# Patient Record
Sex: Female | Born: 1935 | Race: White | Hispanic: No | Marital: Married | Smoking: Former smoker
Health system: Southern US, Community
[De-identification: ages and names within clinical notes are randomized; demographics above are authoritative.]

## PROBLEM LIST (undated history)

## (undated) DIAGNOSIS — I82409 Acute embolism and thrombosis of unspecified deep veins of unspecified lower extremity: Secondary | ICD-10-CM

## (undated) DIAGNOSIS — I495 Sick sinus syndrome: Secondary | ICD-10-CM

## (undated) DIAGNOSIS — I7100 Dissection of unspecified site of aorta: Secondary | ICD-10-CM

## (undated) DIAGNOSIS — Z9010 Acquired absence of unspecified breast and nipple: Secondary | ICD-10-CM

## (undated) DIAGNOSIS — I4891 Unspecified atrial fibrillation: Secondary | ICD-10-CM

## (undated) DIAGNOSIS — I10 Essential (primary) hypertension: Secondary | ICD-10-CM

## (undated) DIAGNOSIS — I493 Ventricular premature depolarization: Secondary | ICD-10-CM

## (undated) DIAGNOSIS — I491 Atrial premature depolarization: Secondary | ICD-10-CM

## (undated) DIAGNOSIS — Z952 Presence of prosthetic heart valve: Secondary | ICD-10-CM

## (undated) DIAGNOSIS — C50919 Malignant neoplasm of unspecified site of unspecified female breast: Secondary | ICD-10-CM

## (undated) HISTORY — DX: Acquired absence of unspecified breast and nipple: Z90.10

## (undated) HISTORY — PX: APPENDECTOMY: SHX54

## (undated) HISTORY — DX: Essential (primary) hypertension: I10

## (undated) HISTORY — DX: Atrial premature depolarization: I49.1

## (undated) HISTORY — DX: Dissection of unspecified site of aorta: I71.00

## (undated) HISTORY — DX: Unspecified atrial fibrillation: I48.91

## (undated) HISTORY — PX: MASTECTOMY: SHX3

## (undated) HISTORY — DX: Sick sinus syndrome: I49.5

## (undated) HISTORY — DX: Ventricular premature depolarization: I49.3

## (undated) HISTORY — DX: Presence of prosthetic heart valve: Z95.2

## (undated) HISTORY — DX: Acute embolism and thrombosis of unspecified deep veins of unspecified lower extremity: I82.409

## (undated) HISTORY — DX: Malignant neoplasm of unspecified site of unspecified female breast: C50.919

---

## 2004-08-22 ENCOUNTER — Ambulatory Visit: Payer: Self-pay | Admitting: Oncology

## 2004-11-14 ENCOUNTER — Ambulatory Visit: Payer: Self-pay | Admitting: Hematology

## 2004-12-05 ENCOUNTER — Ambulatory Visit: Payer: Self-pay | Admitting: Oncology

## 2005-02-13 ENCOUNTER — Ambulatory Visit: Payer: Self-pay | Admitting: Oncology

## 2005-05-08 ENCOUNTER — Ambulatory Visit: Payer: Self-pay | Admitting: Oncology

## 2005-08-14 ENCOUNTER — Ambulatory Visit: Payer: Self-pay | Admitting: Oncology

## 2005-11-22 ENCOUNTER — Ambulatory Visit: Payer: Self-pay | Admitting: Oncology

## 2006-04-04 ENCOUNTER — Ambulatory Visit: Payer: Self-pay | Admitting: Oncology

## 2006-04-13 ENCOUNTER — Encounter: Admission: RE | Admit: 2006-04-13 | Discharge: 2006-04-13 | Payer: Self-pay | Admitting: Oncology

## 2006-10-04 ENCOUNTER — Ambulatory Visit: Payer: Self-pay | Admitting: Oncology

## 2007-04-18 ENCOUNTER — Ambulatory Visit: Payer: Self-pay | Admitting: Oncology

## 2008-12-22 ENCOUNTER — Encounter: Payer: Self-pay | Admitting: Internal Medicine

## 2009-01-01 ENCOUNTER — Encounter: Payer: Self-pay | Admitting: Internal Medicine

## 2009-01-04 DIAGNOSIS — K219 Gastro-esophageal reflux disease without esophagitis: Secondary | ICD-10-CM

## 2009-01-04 DIAGNOSIS — R42 Dizziness and giddiness: Secondary | ICD-10-CM | POA: Insufficient documentation

## 2009-01-04 DIAGNOSIS — E781 Pure hyperglyceridemia: Secondary | ICD-10-CM

## 2009-01-04 DIAGNOSIS — I10 Essential (primary) hypertension: Secondary | ICD-10-CM

## 2009-01-06 ENCOUNTER — Ambulatory Visit: Payer: Self-pay | Admitting: Internal Medicine

## 2009-01-19 ENCOUNTER — Telehealth (INDEPENDENT_AMBULATORY_CARE_PROVIDER_SITE_OTHER): Payer: Self-pay | Admitting: Radiology

## 2009-01-20 ENCOUNTER — Encounter: Payer: Self-pay | Admitting: Internal Medicine

## 2009-01-20 ENCOUNTER — Ambulatory Visit: Payer: Self-pay

## 2009-02-11 ENCOUNTER — Ambulatory Visit: Payer: Self-pay | Admitting: Internal Medicine

## 2009-02-11 DIAGNOSIS — I062 Rheumatic aortic stenosis with insufficiency: Secondary | ICD-10-CM | POA: Insufficient documentation

## 2009-02-11 DIAGNOSIS — I4891 Unspecified atrial fibrillation: Secondary | ICD-10-CM | POA: Insufficient documentation

## 2009-06-22 ENCOUNTER — Encounter (INDEPENDENT_AMBULATORY_CARE_PROVIDER_SITE_OTHER): Payer: Self-pay | Admitting: *Deleted

## 2010-02-02 ENCOUNTER — Ambulatory Visit: Payer: Self-pay | Admitting: Internal Medicine

## 2010-02-03 ENCOUNTER — Encounter: Payer: Self-pay | Admitting: Internal Medicine

## 2010-02-03 DIAGNOSIS — R0989 Other specified symptoms and signs involving the circulatory and respiratory systems: Secondary | ICD-10-CM | POA: Insufficient documentation

## 2010-02-10 ENCOUNTER — Ambulatory Visit: Payer: Self-pay | Admitting: Cardiology

## 2010-02-10 ENCOUNTER — Encounter: Payer: Self-pay | Admitting: Internal Medicine

## 2010-02-10 ENCOUNTER — Ambulatory Visit (HOSPITAL_COMMUNITY): Admission: RE | Admit: 2010-02-10 | Discharge: 2010-02-10 | Payer: Self-pay | Admitting: Internal Medicine

## 2010-02-10 ENCOUNTER — Inpatient Hospital Stay (HOSPITAL_BASED_OUTPATIENT_CLINIC_OR_DEPARTMENT_OTHER): Admission: RE | Admit: 2010-02-10 | Discharge: 2010-02-10 | Payer: Self-pay | Admitting: Cardiology

## 2010-02-10 ENCOUNTER — Ambulatory Visit: Payer: Self-pay | Admitting: Vascular Surgery

## 2010-02-24 ENCOUNTER — Ambulatory Visit: Payer: Self-pay | Admitting: Cardiothoracic Surgery

## 2010-02-25 ENCOUNTER — Encounter: Payer: Self-pay | Admitting: Internal Medicine

## 2010-03-03 ENCOUNTER — Ambulatory Visit: Payer: Self-pay | Admitting: Internal Medicine

## 2010-03-03 DIAGNOSIS — I2510 Atherosclerotic heart disease of native coronary artery without angina pectoris: Secondary | ICD-10-CM | POA: Insufficient documentation

## 2010-03-03 DIAGNOSIS — I359 Nonrheumatic aortic valve disorder, unspecified: Secondary | ICD-10-CM | POA: Insufficient documentation

## 2010-03-10 ENCOUNTER — Ambulatory Visit: Payer: Self-pay | Admitting: Cardiothoracic Surgery

## 2010-03-24 ENCOUNTER — Encounter: Payer: Self-pay | Admitting: Cardiothoracic Surgery

## 2010-03-28 ENCOUNTER — Inpatient Hospital Stay (HOSPITAL_COMMUNITY): Admission: RE | Admit: 2010-03-28 | Discharge: 2010-04-25 | Payer: Self-pay | Admitting: Cardiothoracic Surgery

## 2010-03-28 ENCOUNTER — Ambulatory Visit: Payer: Self-pay | Admitting: Pulmonary Disease

## 2010-03-28 ENCOUNTER — Encounter: Payer: Self-pay | Admitting: Cardiothoracic Surgery

## 2010-03-28 ENCOUNTER — Ambulatory Visit: Payer: Self-pay | Admitting: Cardiothoracic Surgery

## 2010-03-28 ENCOUNTER — Ambulatory Visit: Payer: Self-pay | Admitting: Cardiology

## 2010-03-29 HISTORY — PX: AORTIC VALVE REPLACEMENT: SHX41

## 2010-03-30 ENCOUNTER — Ambulatory Visit: Payer: Self-pay | Admitting: Physical Medicine & Rehabilitation

## 2010-04-02 ENCOUNTER — Encounter: Payer: Self-pay | Admitting: Cardiothoracic Surgery

## 2010-04-05 ENCOUNTER — Encounter: Payer: Self-pay | Admitting: Cardiothoracic Surgery

## 2010-04-08 ENCOUNTER — Encounter: Payer: Self-pay | Admitting: Cardiothoracic Surgery

## 2010-04-10 ENCOUNTER — Ambulatory Visit: Payer: Self-pay | Admitting: Vascular Surgery

## 2010-04-15 ENCOUNTER — Encounter: Payer: Self-pay | Admitting: Internal Medicine

## 2010-04-28 ENCOUNTER — Encounter: Payer: Self-pay | Admitting: Cardiovascular Disease

## 2010-05-02 ENCOUNTER — Encounter: Payer: Self-pay | Admitting: Cardiology

## 2010-05-02 LAB — CONVERTED CEMR LAB: POC INR: 1.3

## 2010-05-09 ENCOUNTER — Encounter: Payer: Self-pay | Admitting: Cardiology

## 2010-05-10 ENCOUNTER — Encounter: Payer: Self-pay | Admitting: Physician Assistant

## 2010-05-12 ENCOUNTER — Encounter: Admission: RE | Admit: 2010-05-12 | Discharge: 2010-05-12 | Payer: Self-pay | Admitting: Cardiothoracic Surgery

## 2010-05-12 ENCOUNTER — Encounter: Payer: Self-pay | Admitting: Internal Medicine

## 2010-05-12 ENCOUNTER — Ambulatory Visit: Payer: Self-pay | Admitting: Cardiothoracic Surgery

## 2010-05-18 ENCOUNTER — Encounter: Payer: Self-pay | Admitting: Cardiovascular Disease

## 2010-05-18 LAB — CONVERTED CEMR LAB
POC INR: 2.4
Prothrombin Time: 24.9 s

## 2010-05-19 ENCOUNTER — Encounter: Payer: Self-pay | Admitting: Physician Assistant

## 2010-05-19 ENCOUNTER — Ambulatory Visit: Payer: Self-pay | Admitting: Internal Medicine

## 2010-05-19 DIAGNOSIS — I739 Peripheral vascular disease, unspecified: Secondary | ICD-10-CM | POA: Insufficient documentation

## 2010-05-23 ENCOUNTER — Ambulatory Visit: Payer: Self-pay | Admitting: Cardiothoracic Surgery

## 2010-05-25 ENCOUNTER — Encounter: Payer: Self-pay | Admitting: Internal Medicine

## 2010-06-01 ENCOUNTER — Encounter: Payer: Self-pay | Admitting: Internal Medicine

## 2010-06-01 ENCOUNTER — Encounter: Payer: Self-pay | Admitting: Cardiology

## 2010-06-01 LAB — CONVERTED CEMR LAB: POC INR: 2.7

## 2010-06-15 ENCOUNTER — Encounter: Payer: Self-pay | Admitting: Cardiovascular Disease

## 2010-06-15 ENCOUNTER — Encounter: Payer: Self-pay | Admitting: Internal Medicine

## 2010-06-15 LAB — CONVERTED CEMR LAB
POC INR: 2.4
Prothrombin Time: 25.1 s

## 2010-06-16 ENCOUNTER — Encounter: Payer: Self-pay | Admitting: Cardiovascular Disease

## 2010-06-17 ENCOUNTER — Ambulatory Visit: Payer: Self-pay | Admitting: Cardiothoracic Surgery

## 2010-06-17 ENCOUNTER — Encounter: Admission: RE | Admit: 2010-06-17 | Discharge: 2010-06-17 | Payer: Self-pay | Admitting: Cardiothoracic Surgery

## 2010-06-17 ENCOUNTER — Encounter: Payer: Self-pay | Admitting: Internal Medicine

## 2010-07-13 ENCOUNTER — Encounter: Payer: Self-pay | Admitting: Internal Medicine

## 2010-07-14 ENCOUNTER — Encounter: Payer: Self-pay | Admitting: Cardiology

## 2010-07-14 LAB — CONVERTED CEMR LAB
POC INR: 2.5
Prothrombin Time: 25.6 s

## 2010-07-20 ENCOUNTER — Ambulatory Visit: Payer: Self-pay | Admitting: Internal Medicine

## 2010-07-21 ENCOUNTER — Encounter: Payer: Self-pay | Admitting: Internal Medicine

## 2010-07-21 ENCOUNTER — Ambulatory Visit: Payer: Self-pay | Admitting: Cardiothoracic Surgery

## 2010-08-10 ENCOUNTER — Encounter: Payer: Self-pay | Admitting: Internal Medicine

## 2010-08-10 LAB — CONVERTED CEMR LAB: POC INR: 2.1

## 2010-09-07 ENCOUNTER — Encounter: Payer: Self-pay | Admitting: Internal Medicine

## 2010-09-07 ENCOUNTER — Encounter: Payer: Self-pay | Admitting: Cardiology

## 2010-09-28 ENCOUNTER — Ambulatory Visit (HOSPITAL_COMMUNITY)
Admission: RE | Admit: 2010-09-28 | Discharge: 2010-09-28 | Payer: Self-pay | Source: Home / Self Care | Attending: Internal Medicine | Admitting: Internal Medicine

## 2010-09-28 ENCOUNTER — Ambulatory Visit: Admission: RE | Admit: 2010-09-28 | Discharge: 2010-09-28 | Payer: Self-pay | Source: Home / Self Care

## 2010-09-28 ENCOUNTER — Ambulatory Visit
Admission: RE | Admit: 2010-09-28 | Discharge: 2010-09-28 | Payer: Self-pay | Source: Home / Self Care | Attending: Internal Medicine | Admitting: Internal Medicine

## 2010-09-28 ENCOUNTER — Encounter: Payer: Self-pay | Admitting: Internal Medicine

## 2010-09-28 DIAGNOSIS — D538 Other specified nutritional anemias: Secondary | ICD-10-CM | POA: Insufficient documentation

## 2010-09-28 LAB — CONVERTED CEMR LAB: POC INR: 1.5

## 2010-10-01 ENCOUNTER — Encounter: Payer: Self-pay | Admitting: Cardiothoracic Surgery

## 2010-10-02 ENCOUNTER — Encounter: Payer: Self-pay | Admitting: Cardiothoracic Surgery

## 2010-10-05 ENCOUNTER — Encounter: Payer: Self-pay | Admitting: Cardiovascular Disease

## 2010-10-05 ENCOUNTER — Encounter: Payer: Self-pay | Admitting: Internal Medicine

## 2010-10-05 LAB — CONVERTED CEMR LAB: POC INR: 1.9

## 2010-10-06 ENCOUNTER — Encounter: Payer: Self-pay | Admitting: Cardiovascular Disease

## 2010-10-09 LAB — CONVERTED CEMR LAB
BUN: 17 mg/dL (ref 6–23)
CO2: 25 meq/L (ref 19–32)
Chloride: 107 meq/L (ref 96–112)
Creatinine, Ser: 1.2 mg/dL (ref 0.4–1.2)
Eosinophils Absolute: 0.1 10*3/uL (ref 0.0–0.7)
Glucose, Bld: 90 mg/dL (ref 70–99)
HCT: 25 % — ABNORMAL LOW (ref 36.0–46.0)
Lymphs Abs: 1.3 10*3/uL (ref 0.7–4.0)
MCHC: 32.8 g/dL (ref 30.0–36.0)
MCV: 83.5 fL (ref 78.0–100.0)
Monocytes Absolute: 0.5 10*3/uL (ref 0.1–1.0)
Neutrophils Relative %: 60.3 % (ref 43.0–77.0)
POC INR: 3.2
Platelets: 239 10*3/uL (ref 150.0–400.0)
RDW: 16.6 % — ABNORMAL HIGH (ref 11.5–14.6)

## 2010-10-11 NOTE — Medication Information (Signed)
Summary: [facility] Clinic  Anticoagulant Therapy  Managed by: Weston Brass, PharmD Referring MD: Hillis Range, MD PCP: Gordy Levan Supervising MD: Clifton James MD, Cristal Deer Indication 1: Mechanical Prosthetic Heart Valve (prevent systemic emboli) Indication 2: Atrial Fibrillation Valve Type: Bioprosthetic PT 25.1 INR POC 2.4 INR RANGE 2.0-3.0  Dietary changes: no    Health status changes: no    Bleeding/hemorrhagic complications: no    Recent/future hospitalizations: no    Any changes in medication regimen? no    Recent/future dental: no  Any missed doses?: no       Is patient compliant with meds? yes      Comments: Lab drawn 10/5.  Received in office on 10/6.   Allergies: 1)  ! * Hydrrocodone 2)  ! Crestor  Anticoagulation Management History:      Her anticoagulation is being managed by telephone today.  Positive risk factors for bleeding include an age of 75 years or older.  The bleeding index is 'intermediate risk'.  Positive CHADS2 values include History of HTN and Age > 11 years old.  Prothrombin time is 25.1.  Anticoagulation responsible Rayel Santizo: Clifton James MD, Cristal Deer.  INR POC: 2.4.    Anticoagulation Management Assessment/Plan:      The patient's current anticoagulation dose is Warfarin sodium 4 mg tabs: Use as directed by Anticoagulation Clinic.  The target INR is 2.0-3.0.  The next INR is due 07/13/2010.  Anticoagulation instructions were given to spouse.  Results were reviewed/authorized by Weston Brass, PharmD.  She was notified by Weston Brass PharmD.         Prior Anticoagulation Instructions: INR 2.7  Spoke with pt's husband.  Continue same dose of 2mg  daily.  Recheck INR in 2 weeks. Orders given to [place] with [facility].   Current Anticoagulation Instructions: INR 2.4  Spoke with pt's husband.  Continue same dose of 2mg  daily.  Recheck INR in 4 weeks.  Orders given to [place] with [facility].

## 2010-10-11 NOTE — Assessment & Plan Note (Signed)
Summary: per check out/sf   Visit Type:  Follow-up Primary Provider:  Dr.James Kinlaw   History of Present Illness: The patient presents today for routine cardiology followup. She continues to make improvements following aortic valve surgery.  Her appetite has improved.  She has had no further syncope since her valve was replaced.  Her digital necrosis continues to gradually improve.  The patient denies symptoms of palpitations, chest pain, shortness of breath, orthopnea, PND, lower extremity edema, dizziness, presyncope, syncope, or neurologic sequela. The patient is tolerating medications without difficulties and is otherwise without complaint today.   New Orders:     1)  TLB-BMP (Basic Metabolic Panel-BMET) (80048-METABOL)     2)  TLB-CBC Platelet - w/Differential (85025-CBCD)     3)  Echocardiogram (Echo)   Current Medications (verified): 1)  Potassium Chloride Cr 10 Meq Cr-Caps (Potassium Chloride) .... Take One Tablet By Mouth Daily 2)  Digoxin 0.25 Mg Tabs (Digoxin) .... Take 1/2  Tablet By Mouth Daily 3)  Warfarin Sodium 2 Mg Tabs (Warfarin Sodium) .... Use As Directed By Anticoagualtion Clinic 4)  Furosemide 20 Mg Tabs (Furosemide) .... Take One Tablet By Mouth Daily. 5)  Fenofibrate 160 Mg Tabs (Fenofibrate) .... Take One Tablet By Mouth Once Daily. 6)  Aspirin 81 Mg Tbec (Aspirin) .... Take One Tablet By Mouth Daily 7)  Pletal 100 Mg Tabs (Cilostazol) .... Take 1 Tablet By Mouth Two Times A Day 8)  Cardizem Cd 180 Mg Xr24h-Cap (Diltiazem Hcl Coated Beads) .... Take 1 Tablet By Mouth Once A Day 9)  Metoprolol Tartrate 100 Mg Tabs (Metoprolol Tartrate) .... Take One Tablet By Mouth Twice A Day 10)  Protonix 40 Mg Tbec (Pantoprazole Sodium) .... Take 1 Tablet By Mouth Once Daily 11)  Silver Sulfadiazine 1 % Crea (Silver Sulfadiazine) .... Daily 12)  Ultram 50 Mg Tabs (Tramadol Hcl) .... As Needed 13)  Boost Plus  Liqd (Nutritional Supplements) .... As Needed  Allergies: 1)  !  * Hydrrocodone 2)  ! Crestor  Past History:  Past Medical History: Critical aortic stenosis, moderate aortic regurgitation s/p AVR by Dr Tyrone Sage 7/11 Paroxysmal atrial fibrillation PACs, PVCs Hypertension Chronic Dizziness GERD HL DVT, chronically anticoagulated with coumadin Emphysema Breast Cancer (Dx 2005), mastectomy and chemotherapy  Past Surgical History: Appendectomy 02/14/06 L Mastectomy 2005 for BrCa Aortic Valve replacement 7/11 by Dr Tyrone Sage  Social History: Reviewed history from 01/06/2009 and no changes required. Married and lives in [place].  Retired after working at [employer]. Tob- quit x 30 years.  ETOH- none.  Denies drugs.  Review of Systems       All systems are reviewed and negative except as listed in the HPI.   Vital Signs:  Patient profile:   77 year old female Height:      64 inches Weight:      99 pounds BMI:     17.05 Pulse rate:   78 / minute BP sitting:   150 / 62  (right arm)  Vitals Entered By: Laurance Flatten CMA (July 20, 2010 12:11 PM)  Physical Exam  General:  Thin and cachectic, in no acute distress. Neck: No JVD, HJR, Bruit, or thyroid enlargement Lungs: No tachypnea, clear without wheezing, rales, or rhonchi Cardiovascular: RRR, PMI not displaced, 2/6 SEM Abdomen: BS normal. Soft without organomegaly, masses, lesions or tenderness. Extremities: Right foot is bandaged. She does have 3 blackened toes which are improving,  digital necrosis on hands has significantly improved, .no edema  bilaterally SKin: Warm, no lesions or rashes  Musculoskeletal: No deformities Neuro: no focal signs    EKG  Procedure date:  07/20/2010  Findings:      sinus rhythm 78 bpm, PR 132, QRS 82, Qtc 408, LVH  Impression & Recommendations:  Problem # 1:  AORTIC VALVE DISORDERS (ICD-424.1) improving s/p AVR she has follow-up with Dr Tyrone Sage this week we will obtain an echo upon return  Problem # 2:  ATRIAL FIBRILLATION  (ICD-427.31) maintaining sinus rhythm continue coumadin CBC today  Problem # 3:  ESSENTIAL HYPERTENSION, BENIGN (ICD-401.1) stable salt restriciton BMET today she will decrease lasix to 20mg  every other day and then consider stopping lasix if her daily weight remains stable  Problem # 4:  SYNCOPE (ICD-780.2) resolved s/p AVR  Other Orders: TLB-BMP (Basic Metabolic Panel-BMET) (80048-METABOL) TLB-CBC Platelet - w/Differential (85025-CBCD) Echocardiogram (Echo)  Patient Instructions: 1)  Your physician recommends that you schedule a follow-up appointment in: 2 months with Echo same day 2)  Your physician has recommended you make the following change in your medication: decrease Lasix(Furosemide) to 20mg  every other day 3)  Weigh daily and record

## 2010-10-11 NOTE — Assessment & Plan Note (Signed)
Summary: eph/appt at 10:45-mb   Visit Type:  Follow-up Primary Provider:  Gordy Roberts  CC:  sob/ dizziness.  History of Present Illness: Savannah Roberts presents today for cardiology follow-up.  She has a history of severe AS and moderate AI.   She reports progressive SOB and decreased exercise capacity.She has not had further presyncope or syncope.  She has frequent episodes of dizziness and presyncope.   The patient denies any symptoms of palpitations, chest pain, orthopnea, PND, lower extremity edema, or neurologic sequela. The patient is tolerating medications without difficulties and is otherwise without complaint today.   Current Medications (verified): 1)  Hyzaar 50-12.5 Mg Tabs (Losartan Potassium-Hctz) .Marland Kitchen.. 1 By Mouth Once Daily 2)  Nadolol 40 Mg Tabs (Nadolol) .Marland Kitchen.. 1 By Mouth Once Daily 3)  Potassium Chloride Crys Cr 20 Meq Cr-Tabs (Potassium Chloride Crys Cr) .... Take One Tablet By Mouth Daily 4)  Digoxin 0.25 Mg Tabs (Digoxin) .... Take 1/2  Tablet By Mouth Daily 5)  Warfarin Sodium 4 Mg Tabs (Warfarin Sodium) .... Use As Directed By Anticoagulation Clinic 6)  Furosemide 40 Mg Tabs (Furosemide) .... Take One Tablet By Mouth Daily. 7)  Fenofibrate 160 Mg Tabs (Fenofibrate) .... Take One Tablet By Mouth Once Daily. 8)  Cyanocobalamin 1000 Mcg/ml Soln (Cyanocobalamin) .... Monthly  Allergies (verified): 1)  ! * Hydrrocodone 2)  ! Crestor  Past History:  Past Medical History: Reviewed history from 02/02/2010 and no changes required. Severe aortic stenosis, moderate aortic regurgitation Paroxysmal atrial fibrillation (I do not have actual documentation) PACs, PVCs Hypertension Chronic Dizziness GERD HL DVT, chronically anticoagulated with coumadin Emphysema Breast Cancer (Dx 2005), mastectomy and chemotherapy  Past Surgical History: Reviewed history from 01/06/2009 and no changes required. Appendectomy 02/14/06 L Mastectomy 2005 for BrCa  Social History: Reviewed  history from 01/06/2009 and no changes required. Married and lives in [place].  Retired after working at [employer]. Tob- quit x 30 years.  ETOH- none.  Denies drugs.  Review of Systems       All systems are reviewed and negative except as listed in the HPI.   Vital Signs:  Patient profile:   77 year old female Height:      64 inches Weight:      108 pounds BMI:     18.61 Pulse rate:   63 / minute BP sitting:   160 / 100  (right arm)  Vitals Entered By: Laurance Flatten CMA (March 03, 2010 11:05 AM)  Physical Exam  General:  thin, anxious appearing Head:  normocephalic and atraumatic Eyes:  PERRLA/EOM intact; conjunctiva and lids normal. Mouth:  Teeth, gums and palate normal. Oral mucosa normal. Neck:  Neck supple, no JVD. No masses, thyromegaly or abnormal cervical nodes.  + L bruit Lungs:  Clear bilaterally to auscultation and percussion. Heart:  RRR, 3/6 SEM LUSB with is late peaking, S2 is not audible.  2/6 diastolic murmur LUSB, 2/6 holosystolic murmur at the apex Abdomen:  Bowel sounds positive; abdomen soft and non-tender without masses, organomegaly, or hernias noted. No hepatosplenomegaly. Msk:  Back normal, normal gait. Muscle strength and tone normal. Pulses:  pulses normal in all 4 extremities Extremities:  No clubbing or cyanosis. Neurologic:  Alert and oriented x 3. Skin:  Intact without lesions or rashes.   Impression & Recommendations:  Problem # 1:  AORTIC VALVE DISORDERS (ICD-424.1)  I discussed results of recent TTE and cath with patient.  She has been evaluated by Dr Tyrone Sage and is  contemplating surgery.  We had a long discussion today regarding her options.  I have strongly recommended that she proceed with surgery.  She has scheduled follow-up with Dr Tyrone Sage in the next week.  Her updated medication list for this problem includes:    Hyzaar 50-12.5 Mg Tabs (Losartan potassium-hctz) .Marland Kitchen... 1 by mouth once daily    Nadolol 40 Mg Tabs (Nadolol)  .Marland Kitchen... 1 by mouth once daily    Digoxin 0.25 Mg Tabs (Digoxin) .Marland Kitchen... Take 1/2  tablet by mouth daily    Furosemide 40 Mg Tabs (Furosemide) .Marland Kitchen... Take one tablet by mouth daily.  Problem # 2:  CAD (ICD-414.00)  She has single vessel CAD.  She may benefit from single vessel bypass at time of her valve surgery.  Her updated medication list for this problem includes:    Nadolol 40 Mg Tabs (Nadolol) .Marland Kitchen... 1 by mouth once daily    Warfarin Sodium 4 Mg Tabs (Warfarin sodium) ..... Use as directed by anticoagulation clinic  Problem # 3:  ATRIAL FIBRILLATION (ICD-427.31) stable continue coumadin  Problem # 4:  SYNCOPE (ICD-780.2) almost certainly due to severe AS. See above  Patient Instructions: 1)  Your physician recommends that you schedule a follow-up appointment in: 8 weeks with Dr Johney Frame. 2)  Your physician recommends that you continue on your current medications as directed. Please refer to the Current Medication list given to you today.

## 2010-10-11 NOTE — Miscellaneous (Signed)
Summary: [facility] Doctor's Orders   [facility] Doctor's Orders   Imported By: Roderic Ovens 06/29/2010 14:59:15  _____________________________________________________________________  External Attachment:    Type:   Image     Comment:   External Document

## 2010-10-11 NOTE — Medication Information (Signed)
Summary: Coumadin Clinic  Anticoagulant Therapy  Managed by: Weston Brass, PharmD Referring MD: Hillis Range, MD PCP: Gordy Levan Supervising MD: Gala Romney MD, Reuel Boom Indication 1: Mechanical Prosthetic Heart Valve (prevent systemic emboli) Indication 2: Atrial Fibrillation Valve Type: Bioprosthetic INR POC 2.1 INR RANGE 2.0-3.0  Dietary changes: no    Health status changes: no    Bleeding/hemorrhagic complications: no    Recent/future hospitalizations: no    Any changes in medication regimen? no    Recent/future dental: no  Any missed doses?: no       Is patient compliant with meds? yes       Allergies: 1)  ! * Hydrrocodone 2)  ! Crestor  Anticoagulation Management History:      Her anticoagulation is being managed by telephone today.  Positive risk factors for bleeding include an age of 5 years or older and presence of serious comorbidities.  The bleeding index is 'intermediate risk'.  Positive CHADS2 values include History of HTN and Age > 77 years old.  Anticoagulation responsible provider: Bensimhon MD, Reuel Boom.  INR POC: 2.1.    Anticoagulation Management Assessment/Plan:      The patient's current anticoagulation dose is Warfarin sodium 2 mg tabs: Use as directed by Anticoagualtion Clinic.  The target INR is 2.0-3.0.  The next INR is due 09/07/2010.  Anticoagulation instructions were given to spouse.  Results were reviewed/authorized by Weston Brass, PharmD.  She was notified by Weston Brass PharmD.         Prior Anticoagulation Instructions: INR 2.5  Called spoke with pt's husband advised to have pt continue on same dosage 2mg  daily.  Called apoke with Fernande Boyden [facility] advised of dosage instructs and recheck in 4 weeks.   Current Anticoagulation Instructions: INR 2.1  Spoke with pt's husband.  Continue same dose of 2 mg daily.  Recheck INR in 4 weeks.  Orders given to [place] with [facility].

## 2010-10-11 NOTE — Medication Information (Signed)
Summary: Coumadin Clinic  Anticoagulant Therapy  Managed by: Weston Brass, PharmD Referring MD: Hillis Range, MD PCP: Gordy Levan Supervising MD: Juanda Chance MD, Bruce Indication 1: Mechanical Prosthetic Heart Valve (prevent systemic emboli) Indication 2: Atrial Fibrillation Valve Type: Bioprosthetic PT 28 INR POC 2.7 INR RANGE 2.0-3.0  Dietary changes: no    Health status changes: no    Bleeding/hemorrhagic complications: no    Recent/future hospitalizations: no    Any changes in medication regimen? no    Recent/future dental: no  Any missed doses?: no       Is patient compliant with meds? yes       Allergies: 1)  ! * Hydrrocodone 2)  ! Crestor  Anticoagulation Management History:      Her anticoagulation is being managed by telephone today.  Positive risk factors for bleeding include an age of 75 years or older.  The bleeding index is 'intermediate risk'.  Positive CHADS2 values include History of HTN and Age > 75 years old.  Prothrombin time is 28.  Anticoagulation responsible provider: Juanda Chance MD, Smitty Cords.  INR POC: 2.7.    Anticoagulation Management Assessment/Plan:      The patient's current anticoagulation dose is Warfarin sodium 4 mg tabs: Use as directed by Anticoagulation Clinic.  The target INR is 2.0-3.0.  The next INR is due 06/15/2010.  Anticoagulation instructions were given to spouse.  Results were reviewed/authorized by Weston Brass, PharmD.  She was notified by Weston Brass PharmD.         Prior Anticoagulation Instructions: INR 3.2 Skip today's dose then resume 2mg s daily. Orders given to [facility] with [facility]. [phone]  Current Anticoagulation Instructions: INR 2.7  Spoke with pt's husband.  Continue same dose of 2mg  daily.  Recheck INR in 2 weeks. Orders given to [place] with [facility].

## 2010-10-11 NOTE — Letter (Signed)
Summary: [facility] Ofice Visit   [facility] Ofice Visit   Imported By: Roderic Ovens 07/28/2010 11:46:13  _____________________________________________________________________  External Attachment:    Type:   Image     Comment:   External Document

## 2010-10-11 NOTE — Medication Information (Signed)
Summary: Coumadin Clinic  Anticoagulant Therapy  Managed by: Leota Sauers, PharmD, BCPS, CPP Referring MD: Hillis Range, MD PCP: Gordy Levan Supervising MD: Excell Seltzer MD, Casimiro Needle Indication 1: Mechanical Prosthetic Heart Valve (prevent systemic emboli) Indication 2: Atrial Fibrillation Valve Type: Bioprosthetic INR POC 1.2  Dietary changes: yes       Details: eating very little  Health status changes: no    Bleeding/hemorrhagic complications: no    Recent/future hospitalizations: no    Any changes in medication regimen? no    Recent/future dental: no  Any missed doses?: no       Is patient compliant with meds? yes      Comments: PT d/c after tissue AVR and Hx afib  d/c from hospital on  8/15 INR 1.6  Coumdin 1mg  qd  Allergies: 1)  ! * Hydrrocodone 2)  ! Crestor  Anticoagulation Management History:      Her anticoagulation is being managed by telephone today.  Positive risk factors for bleeding include an age of 76 years or older.  The bleeding index is 'intermediate risk'.  Positive CHADS2 values include History of HTN and Age > 27 years old.  Anticoagulation responsible provider: Excell Seltzer MD, Casimiro Needle.  INR POC: 1.2.    Anticoagulation Management Assessment/Plan:      The patient's current anticoagulation dose is Warfarin sodium 4 mg tabs: Use as directed by Anticoagulation Clinic.  Results were reviewed/authorized by Leota Sauers, PharmD, BCPS, CPP.         Current Anticoagulation Instructions: INR 1.2 per home health nurse Coumadin 2mg  x1 and then 1 mg once daily recheck on Monday family called

## 2010-10-11 NOTE — Cardiovascular Report (Signed)
Summary: Pre Cath Orders   Pre Cath Orders   Imported By: Roderic Ovens 02/16/2010 15:34:18  _____________________________________________________________________  External Attachment:    Type:   Image     Comment:   External Document

## 2010-10-11 NOTE — Letter (Signed)
Summary: Cardiac Catheterization Instructions- [facility]  [employer], Main Office  [address]   [place], [place] [address]   Phone: [phone]  Fax: [phone]     02/03/2010 MRN: [id number]  Savannah Roberts [address] [place], [place]  [address]  Dear Ms. Halvorson,   You are scheduled for a Cardiac Catheterization on 02/10/10 with Dr.Hochrein  Please arrive to the 1st floor of the Heart and Vascular Center at [facility] at 10:00 am on the day of your procedure. Please do not arrive before 6:30 a.m. Call the Heart and Vascular Center at [phone] if you are unable to make your appointmnet. The Code to get into the parking garage under the building is 0100. Take the elevators to the 1st floor. You must have someone to drive you home. Someone must be with you for the first 24 hours after you arrive home. Please wear clothes that are easy to get on and off and wear slip-on shoes. Do not eat or drink after midnight except water with your medications that morning. Bring all your medications and current insurance cards with you.  ___ DO NOT take these medications before your procedure: FUROSEMIDE       The usual length of stay after your procedure is 2 to 3 hours. This can vary.  If you have any questions, please call the office at the number listed above.   Dennis Bast, RN, BSN

## 2010-10-11 NOTE — Medication Information (Signed)
Summary: Coumadin Clinic  Anticoagulant Therapy  Managed by: Cloyde Reams, RN, BSN Referring MD: Hillis Range, MD PCP: Gordy Levan Supervising MD: Daleen Squibb MD, Maisie Fus Indication 1: Mechanical Prosthetic Heart Valve (prevent systemic emboli) Indication 2: Atrial Fibrillation Valve Type: Bioprosthetic PT 25.6 INR POC 2.5 INR RANGE 2.0-3.0    Bleeding/hemorrhagic complications: no     Any changes in medication regimen? no     Any missed doses?: no       Is patient compliant with meds? yes       Allergies: 1)  ! * Hydrrocodone 2)  ! Crestor  Anticoagulation Management History:      Her anticoagulation is being managed by telephone today.  Positive risk factors for bleeding include an age of 75 years or older.  The bleeding index is 'intermediate risk'.  Positive CHADS2 values include History of HTN and Age > 75 years old.  Prothrombin time is 25.6.  Anticoagulation responsible provider: Daleen Squibb MD, Maisie Fus.  INR POC: 2.5.    Anticoagulation Management Assessment/Plan:      The patient's current anticoagulation dose is Warfarin sodium 4 mg tabs: Use as directed by Anticoagulation Clinic.  The target INR is 2.0-3.0.  The next INR is due 08/11/2010.  Anticoagulation instructions were given to spouse.  Results were reviewed/authorized by Cloyde Reams, RN, BSN.  She was notified by Cloyde Reams RN.         Prior Anticoagulation Instructions: INR 2.4  Spoke with pt's husband.  Continue same dose of 2mg  daily.  Recheck INR in 4 weeks.  Orders given to [place] with [facility].   Current Anticoagulation Instructions: INR 2.5  Called spoke with pt's husband advised to have pt continue on same dosage 2mg  daily.  Called apoke with Fernande Boyden [facility] advised of dosage instructs and recheck in 4 weeks.

## 2010-10-11 NOTE — Letter (Signed)
Summary: [facility] Office Visit   [facility] Office Visit   Imported By: Roderic Ovens 06/28/2010 11:21:53  _____________________________________________________________________  External Attachment:    Type:   Image     Comment:   External Document

## 2010-10-11 NOTE — Miscellaneous (Signed)
Summary: Orders Update--Carotid dopplers  Clinical Lists Changes  Problems: Added new problem of CAROTID BRUIT (ICD-785.9) Orders: Added new Test order of Carotid Duplex (Carotid Duplex) - Signed

## 2010-10-11 NOTE — Consult Note (Signed)
Summary: [facility]  [facility]   Imported By: Marylou Mccoy 04/29/2010 11:32:26  _____________________________________________________________________  External Attachment:    Type:   Image     Comment:   External Document

## 2010-10-11 NOTE — Assessment & Plan Note (Signed)
Summary: per chekc out/sf   Visit Type:  Follow-up Primary Provider:  Gordy Levan  CC:  Patient denies any chest pains or Sob. She had legs edema but it is ok now.  History of Present Illness: This is a 77 year old white female patient who was admitted to the hospital with syncope and critical aortic stenosis. She underwent aortic valve replacement with a pericardial tissue valve March 29, 2010. 2-D echo July 23 showed an ejection fraction of 65-70% trivial AI and mild pulmonary hypertension.  The patient had a lengthy postop course and was hospitalized for approximately one month. She developed aspiration pneumonia, heme positive stools, for which she will need an endoscopy and colonoscopy eventually. The patient also developed cyanosis of her toes on her right lower extremity and ischemic toes. She is currently being followed by a specialist in [place]. Her toes are bandaged with a cream in place and they are improving.  He patient is slowly recovering. She walks with a walker and has very little appetite. Her husband seems to be taking good care of her and she is gradually improving. She denies any chest pain, dyspnea ,dyspnea on exertion, dizziness, or presyncope. Occasionally she'll have an irregular heartbeat that lasts one to 2 hours and then resolves. Her husband usually puts a blood pressure monitor on her heart rate is stable.  Current Medications (verified): 1)  Potassium Chloride Crys Cr 20 Meq Cr-Tabs (Potassium Chloride Crys Cr) .... Take One Tablet By Mouth Daily 2)  Digoxin 0.25 Mg Tabs (Digoxin) .... Take 1/2  Tablet By Mouth Daily 3)  Warfarin Sodium 4 Mg Tabs (Warfarin Sodium) .... Use As Directed By Anticoagulation Clinic 4)  Furosemide 40 Mg Tabs (Furosemide) .... Take One Tablet By Mouth Daily. 5)  Fenofibrate 160 Mg Tabs (Fenofibrate) .... Take One Tablet By Mouth Once Daily. 6)  Cyanocobalamin 1000 Mcg/ml Soln (Cyanocobalamin) .... Monthly 7)  Aspirin 81 Mg Tbec  (Aspirin) .... Take One Tablet By Mouth Daily 8)  Pletal 100 Mg Tabs (Cilostazol) .... Take 1 Tablet By Mouth Two Times A Day 9)  Cardizem Cd 180 Mg Xr24h-Cap (Diltiazem Hcl Coated Beads) .... Take 1 Tablet By Mouth Once A Day 10)  Metoprolol Tartrate 100 Mg Tabs (Metoprolol Tartrate) .... Take One Tablet By Mouth Twice A Day 11)  Protonix 40 Mg Tbec (Pantoprazole Sodium) .... Take 1 Tablet By Mouth Two Times A Day 12)  Silver Sulfadiazine 1 % Crea (Silver Sulfadiazine) .... Daily 13)  Ultram 50 Mg Tabs (Tramadol Hcl) .... As Needed 14)  Boost Plus  Liqd (Nutritional Supplements) .... Once A Day  Allergies: 1)  ! * Hydrrocodone 2)  ! Crestor  Past History:  Past Medical History: Last updated: 02/02/2010 Severe aortic stenosis, moderate aortic regurgitation Paroxysmal atrial fibrillation (I do not have actual documentation) PACs, PVCs Hypertension Chronic Dizziness GERD HL DVT, chronically anticoagulated with coumadin Emphysema Breast Cancer (Dx 2005), mastectomy and chemotherapy  Review of Systems       see history of present illness  Vital Signs:  Patient profile:   77 year old female Height:      64 inches Weight:      102.50 pounds BMI:     17.66 Pulse rate:   74 / minute Pulse rhythm:   regular Resp:     18 per minute BP sitting:   128 / 60  (right arm) Cuff size:   regular  Vitals Entered By: Vikki Ports (May 19, 2010 11:51 AM)  Physical Exam  General:  Thin and cachectic, in no acute distress. Neck: No JVD, HJR, Bruit, or thyroid enlargement Lungs: No tachypnea, clear without wheezing, rales, or rhonchi Cardiovascular: RRR, PMI not displaced, there were 6 systolic murmur at the left sternal border,no bruit, thrill, or heave. Abdomen: BS normal. Soft without organomegaly, masses, lesions or tenderness. Extremities: Right foot is bandaged. She does have blackened toes but they were mostly covered and have a thick cream on them.no edema  bilaterally SKin: Warm, no lesions or rashes  Musculoskeletal: No deformities Neuro: no focal signs    EKG  Procedure date:  05/19/2010  Findings:      normal sinus rhythm with LVH  Impression & Recommendations:  Problem # 1:  AORTIC VALVE DISORDERS (ICD-424.1) Patient underwent pericardial tissue valve replacement of the aortic valve March 29, 2010 patient has had a prolonged hospitalization and postop course. She is stable from a cardiac standpoint and does not need any changes at this time. We will refill her medications. The following medications were removed from the medication list:    Hyzaar 50-12.5 Mg Tabs (Losartan potassium-hctz) .Marland Kitchen... 1 by mouth once daily    Nadolol 40 Mg Tabs (Nadolol) .Marland Kitchen... 1 by mouth once daily Her updated medication list for this problem includes:    Digoxin 0.25 Mg Tabs (Digoxin) .Marland Kitchen... Take 1/2  tablet by mouth daily    Furosemide 40 Mg Tabs (Furosemide) .Marland Kitchen... Take one tablet by mouth daily.    Metoprolol Tartrate 100 Mg Tabs (Metoprolol tartrate) .Marland Kitchen... Take one tablet by mouth twice a day  Problem # 2:  ATRIAL FIBRILLATION (ICD-427.31) Patient is in normal sinus rhythm. It sounds like she has episodic atrial fibrillation. She is on Coumadin. The following medications were removed from the medication list:    Nadolol 40 Mg Tabs (Nadolol) .Marland Kitchen... 1 by mouth once daily Her updated medication list for this problem includes:    Digoxin 0.25 Mg Tabs (Digoxin) .Marland Kitchen... Take 1/2  tablet by mouth daily    Warfarin Sodium 4 Mg Tabs (Warfarin sodium) ..... Use as directed by anticoagulation clinic    Aspirin 81 Mg Tbec (Aspirin) .Marland Kitchen... Take one tablet by mouth daily    Pletal 100 Mg Tabs (Cilostazol) .Marland Kitchen... Take 1 tablet by mouth two times a day    Metoprolol Tartrate 100 Mg Tabs (Metoprolol tartrate) .Marland Kitchen... Take one tablet by mouth twice a day  Orders: EKG w/ Interpretation (93000)  Problem # 3:  ESSENTIAL HYPERTENSION, BENIGN (ICD-401.1) blood pressure is  stable The following medications were removed from the medication list:    Hyzaar 50-12.5 Mg Tabs (Losartan potassium-hctz) .Marland Kitchen... 1 by mouth once daily    Nadolol 40 Mg Tabs (Nadolol) .Marland Kitchen... 1 by mouth once daily Her updated medication list for this problem includes:    Furosemide 40 Mg Tabs (Furosemide) .Marland Kitchen... Take one tablet by mouth daily.    Aspirin 81 Mg Tbec (Aspirin) .Marland Kitchen... Take one tablet by mouth daily    Cardizem Cd 180 Mg Xr24h-cap (Diltiazem hcl coated beads) .Marland Kitchen... Take 1 tablet by mouth once a day    Metoprolol Tartrate 100 Mg Tabs (Metoprolol tartrate) .Marland Kitchen... Take one tablet by mouth twice a day  Orders: EKG w/ Interpretation (93000)  Problem # 4:  PVD (ICD-443.9) Patient had ischemic right foot that is improving and being followed regularly by her physician in [place].  Problem # 5:  CAD (ICD-414.00) Patient does have coronary artery disease. Consideration for bypass to the right coronary artery with the aortic valve  replacement was thought about that because of her ischemic peripheral vascular disease with a left elected not to do concomitant coronary artery bypass grafting to the RCA. The following medications were removed from the medication list:    Nadolol 40 Mg Tabs (Nadolol) .Marland Kitchen... 1 by mouth once daily Her updated medication list for this problem includes:    Warfarin Sodium 4 Mg Tabs (Warfarin sodium) ..... Use as directed by anticoagulation clinic    Aspirin 81 Mg Tbec (Aspirin) .Marland Kitchen... Take one tablet by mouth daily    Pletal 100 Mg Tabs (Cilostazol) .Marland Kitchen... Take 1 tablet by mouth two times a day    Cardizem Cd 180 Mg Xr24h-cap (Diltiazem hcl coated beads) .Marland Kitchen... Take 1 tablet by mouth once a day    Metoprolol Tartrate 100 Mg Tabs (Metoprolol tartrate) .Marland Kitchen... Take one tablet by mouth twice a day  Orders: EKG w/ Interpretation (93000)  Patient Instructions: 1)  Your physician recommends that you schedule a follow-up appointment in: 1 to 2 months with Dr. Johney Frame. 2)   Your physician recommends that you continue on your current medications as directed. Please refer to the Current Medication list given to you today. Prescriptions: PROTONIX 40 MG TBEC (PANTOPRAZOLE SODIUM) Take 1 tablet by mouth two times a day  #180 x 3   Entered by:   Ollen Gross, RN, BSN   Authorized by:   Marletta Lor, PA-C   Signed by:   Ollen Gross, RN, BSN on 05/19/2010   Method used:   Print then Give to Patient   RxID:   [id number] METOPROLOL TARTRATE 100 MG TABS (METOPROLOL TARTRATE) Take one tablet by mouth twice a day  #180 x 3   Entered by:   Ollen Gross, RN, BSN   Authorized by:   Marletta Lor, PA-C   Signed by:   Ollen Gross, RN, BSN on 05/19/2010   Method used:   Print then Give to Patient   RxID:   [id number] CARDIZEM CD 180 MG XR24H-CAP (DILTIAZEM HCL COATED BEADS) Take 1 tablet by mouth once a day  #90 x 3   Entered by:   Ollen Gross, RN, BSN   Authorized by:   Marletta Lor, PA-C   Signed by:   Ollen Gross, RN, BSN on 05/19/2010   Method used:   Print then Give to Patient   RxID:   [id number] PLETAL 100 MG TABS (CILOSTAZOL) Take 1 tablet by mouth two times a day  #180 x 3   Entered by:   Ollen Gross, RN, BSN   Authorized by:   Marletta Lor, PA-C   Signed by:   Ollen Gross, RN, BSN on 05/19/2010   Method used:   Print then Give to Patient   RxID:   [id number]

## 2010-10-11 NOTE — Medication Information (Signed)
Summary: Coumadin Clinic  Anticoagulant Therapy  Managed by: Cloyde Reams, RN, BSN Referring MD: Hillis Range, MD PCP: Gordy Levan Supervising MD: Antoine Poche MD, Fayrene Fearing Indication 1: Mechanical Prosthetic Heart Valve (prevent systemic emboli) Indication 2: Atrial Fibrillation Valve Type: Bioprosthetic INR POC 1.3 INR RANGE 2.0-3.0  Dietary changes: yes       Details: Diet not improved.   Bleeding/hemorrhagic complications: no     Any changes in medication regimen? no     Any missed doses?: no       Is patient compliant with meds? yes      Comments: Pt was previously on Coumadin 3mg  x 4 days and 4mg  x 3 days before hospitalization.  Pt has been taking 1mg  Coumadin daily and INR is dropping.    Allergies: 1)  ! * Hydrrocodone 2)  ! Crestor  Anticoagulation Management History:      Her anticoagulation is being managed by telephone today.  Positive risk factors for bleeding include an age of 75 years or older.  The bleeding index is 'intermediate risk'.  Positive CHADS2 values include History of HTN and Age > 59 years old.  Anticoagulation responsible provider: Antoine Poche MD, Fayrene Fearing.  INR POC: 1.3.    Anticoagulation Management Assessment/Plan:      The patient's current anticoagulation dose is Warfarin sodium 4 mg tabs: Use as directed by Anticoagulation Clinic.  The target INR is 2.0-3.0.  The next INR is due 05/09/2010.  Anticoagulation instructions were given to spouse.  Results were reviewed/authorized by Cloyde Reams, RN, BSN.  She was notified by Cloyde Reams RN.         Prior Anticoagulation Instructions: INR 1.2 per home health nurse Coumadin 2mg  x1 and then 1 mg once daily recheck on Monday family called  Current Anticoagulation Instructions: INR 1.3  Called spoke with pt's husband, advised to have pt incr dosage to 2mg  Coumadin daily.  Recheck in 1 week.  Called Brandy with [facility] advised of dosage change and redraw instructions.

## 2010-10-11 NOTE — Medication Information (Signed)
Summary: Coumadin Clinic  Anticoagulant Therapy  Managed by: Cloyde Reams, RN, BSN Referring MD: Hillis Range, MD PCP: Gordy Levan Supervising MD: Clifton James MD, Cristal Deer Indication 1: Mechanical Prosthetic Heart Valve (prevent systemic emboli) Indication 2: Atrial Fibrillation Valve Type: Bioprosthetic PT 24.9 INR POC 2.4 INR RANGE 2.0-3.0    Bleeding/hemorrhagic complications: no     Any changes in medication regimen? no     Any missed doses?: no       Is patient compliant with meds? yes       Allergies: 1)  ! * Hydrrocodone 2)  ! Crestor  Anticoagulation Management History:      Her anticoagulation is being managed by telephone today.  Positive risk factors for bleeding include an age of 75 years or older.  The bleeding index is 'intermediate risk'.  Positive CHADS2 values include History of HTN and Age > 75 years old.  Prothrombin time is 24.9.  Anticoagulation responsible Lavell Ridings: Clifton James MD, Cristal Deer.  INR POC: 2.4.    Anticoagulation Management Assessment/Plan:      The patient's current anticoagulation dose is Warfarin sodium 4 mg tabs: Use as directed by Anticoagulation Clinic.  The target INR is 2.0-3.0.  The next INR is due 05/25/2010.  Anticoagulation instructions were given to spouse.  Results were reviewed/authorized by Cloyde Reams, RN, BSN.  She was notified by Cloyde Reams RN.         Prior Anticoagulation Instructions: INR 2.0  Spoke with pt's husband.  Continue same dose of 2mg  daily.  Recheck INR in 1 week.  Orders given to [place] with [facility].   Current Anticoagulation Instructions: INR 2.4 LMOM on 05/18/10 @ 3:20 pm, EAC Attempted to call back pt with results.  LMTCB. Cloyde Reams RN  May 19, 2010 11:05 AM  Spoke with pt's husband advised to continue on same dosage 2mg  daily.  Recheck in 1 week.  Called Fernande Boyden [facility] advised to recheck in 1 week.

## 2010-10-11 NOTE — Miscellaneous (Signed)
  Clinical Lists Changes  Observations: Added new observation of CARDCATHFIND: 1. Severe aortic stenosis. 2. Single-vessel coronary artery disease.   PLAN:  The patient is to have an echocardiogram later today.  This will be reviewed by doctors, Dr. Tyrone Sage and Dr. Johney Frame, with consideration of valve surgery.  The patient is still not sure she would want to have this done. (02/19/2010 14:41)      Cardiac Cath  Procedure date:  02/19/2010  Findings:      1. Severe aortic stenosis. 2. Single-vessel coronary artery disease.   PLAN:  The patient is to have an echocardiogram later today.  This will be reviewed by doctors, Dr. Tyrone Sage and Dr. Johney Frame, with consideration of valve surgery.  The patient is still not sure she would want to have this done.

## 2010-10-11 NOTE — Medication Information (Signed)
Summary: Coumadin Clinic  Anticoagulant Therapy  Managed by: Bethena Midget, RN, BSN Referring MD: Hillis Range, MD PCP: Gordy Levan Supervising MD: Johney Frame MD, Fayrene Fearing Indication 1: Mechanical Prosthetic Heart Valve (prevent systemic emboli) Indication 2: Atrial Fibrillation Valve Type: Bioprosthetic PT 32.8 INR POC 3.2 INR RANGE 2.0-3.0  Dietary changes: yes       Details: Appetite poor  Health status changes: no    Bleeding/hemorrhagic complications: no    Recent/future hospitalizations: no    Any changes in medication regimen? no    Recent/future dental: no  Any missed doses?: no       Is patient compliant with meds? yes       Allergies: 1)  ! * Hydrrocodone 2)  ! Crestor  Anticoagulation Management History:      Her anticoagulation is being managed by telephone today.  Positive risk factors for bleeding include an age of 75 years or older.  The bleeding index is 'intermediate risk'.  Positive CHADS2 values include History of HTN and Age > 75 years old.  Prothrombin time is 32.8.  Anticoagulation responsible provider: Allred MD, Fayrene Fearing.  INR POC: 3.2.    Anticoagulation Management Assessment/Plan:      The patient's current anticoagulation dose is Warfarin sodium 4 mg tabs: Use as directed by Anticoagulation Clinic.  The target INR is 2.0-3.0.  The next INR is due 06/01/2010.  Anticoagulation instructions were given to spouse.  Results were reviewed/authorized by Bethena Midget, RN, BSN.  She was notified by Bethena Midget, RN, BSN.         Prior Anticoagulation Instructions: INR 2.4 LMOM on 05/18/10 @ 3:20 pm, EAC Attempted to call back pt with results.  LMTCB. Cloyde Reams RN  May 19, 2010 11:05 AM  Spoke with pt's husband advised to continue on same dosage 2mg  daily.  Recheck in 1 week.  Called Fernande Boyden [facility] advised to recheck in 1 week.   Current Anticoagulation Instructions: INR 3.2 Skip today's dose then resume 2mg s daily. Orders given to [facility] with  [facility]. [phone]

## 2010-10-11 NOTE — Consult Note (Signed)
Summary: [facility]  [facility]   Imported By: Debby Freiberg 03/05/2010 12:08:21  _____________________________________________________________________  External Attachment:    Type:   Image     Comment:   External Document

## 2010-10-11 NOTE — Medication Information (Signed)
Summary: Coumadin Clinic  Anticoagulant Therapy  Managed by: Weston Brass, PharmD Referring MD: Hillis Range, MD PCP: Gordy Levan Supervising MD: Riley Kill MD, Maisie Fus Indication 1: Mechanical Prosthetic Heart Valve (prevent systemic emboli) Indication 2: Atrial Fibrillation Valve Type: Bioprosthetic PT 20.9 INR POC 2.0 INR RANGE 2.0-3.0  Dietary changes: no    Health status changes: no    Bleeding/hemorrhagic complications: no    Recent/future hospitalizations: no    Any changes in medication regimen? no    Recent/future dental: no  Any missed doses?: no       Is patient compliant with meds? yes       Allergies: 1)  ! * Hydrrocodone 2)  ! Crestor  Anticoagulation Management History:      Her anticoagulation is being managed by telephone today.  Positive risk factors for bleeding include an age of 75 years or older.  The bleeding index is 'intermediate risk'.  Positive CHADS2 values include History of HTN and Age > 66 years old.  Prothrombin time is 20.9.  Anticoagulation responsible provider: Riley Kill MD, Maisie Fus.  INR POC: 2.0.    Anticoagulation Management Assessment/Plan:      The patient's current anticoagulation dose is Warfarin sodium 4 mg tabs: Use as directed by Anticoagulation Clinic.  The target INR is 2.0-3.0.  The next INR is due 05/18/2010.  Anticoagulation instructions were given to spouse.  Results were reviewed/authorized by Weston Brass, PharmD.  She was notified by Weston Brass PharmD.         Prior Anticoagulation Instructions: INR 1.3  Called spoke with pt's husband, advised to have pt incr dosage to 2mg  Coumadin daily.  Recheck in 1 week.  Called Brandy with [facility] advised of dosage change and redraw instructions.    Current Anticoagulation Instructions: INR 2.0  Spoke with pt's husband.  Continue same dose of 2mg  daily.  Recheck INR in 1 week.  Orders given to [place] with [facility].

## 2010-10-11 NOTE — Assessment & Plan Note (Signed)
Summary: f1y/g d   Visit Type:  Follow-up Primary Provider:  Gordy Levan   History of Present Illness: Savannah Roberts presents today for cardiology follow-up.  She has a history of severe AS and moderate AI.  Unfortunately, she has not been compliant with follow-up to my office.  She reports progressive SOB and decreased exercise capacity.  She was recently seen by Dr Lawana Pai and her lasix was increased.  She also reports having an episode of syncope while seated several weeka go.  She has frequent episodes of dizziness and presyncope.   The patient denies any symptoms of palpitations, chest pain, orthopnea, PND, lower extremity edema, or neurologic sequela. The patient is tolerating medications without difficulties and is otherwise without complaint today.   Current Medications (verified): 1)  Hyzaar 50-12.5 Mg Tabs (Losartan Potassium-Hctz) .Marland Kitchen.. 1 By Mouth Once Daily 2)  Nadolol 40 Mg Tabs (Nadolol) .Marland Kitchen.. 1 By Mouth Once Daily 3)  Potassium Chloride Crys Cr 20 Meq Cr-Tabs (Potassium Chloride Crys Cr) .... Take One Tablet By Mouth Daily 4)  Digoxin 0.25 Mg Tabs (Digoxin) .... Take 1/2  Tablet By Mouth Daily 5)  Warfarin Sodium 4 Mg Tabs (Warfarin Sodium) .... Use As Directed By Anticoagulation Clinic 6)  Furosemide 40 Mg Tabs (Furosemide) .... Take One Tablet By Mouth Daily. 7)  Fenofibrate 54 Mg Tabs (Fenofibrate) .... Take One Tablet By Mouth Daily With A Meal 8)  Cyanocobalamin 1000 Mcg/ml Soln (Cyanocobalamin) .... Monthly  Allergies (verified): 1)  ! * Hydrrocodone 2)  ! Crestor  Past History:  Past Medical History: Severe aortic stenosis, moderate aortic regurgitation Paroxysmal atrial fibrillation (I do not have actual documentation) PACs, PVCs Hypertension Chronic Dizziness GERD HL DVT, chronically anticoagulated with coumadin Emphysema Breast Cancer (Dx 2005), mastectomy and chemotherapy  Past Surgical History: Reviewed history from 01/06/2009 and no changes  required. Appendectomy 02/14/06 L Mastectomy 2005 for BrCa  Social History: Reviewed history from 01/06/2009 and no changes required. Married and lives in [place].  Retired after working at [employer]. Tob- quit x 30 years.  ETOH- none.  Denies drugs.  Review of Systems       All systems are reviewed and negative except as listed in the HPI.   Vital Signs:  Patient profile:   77 year old female BP sitting:   136 / 70  (left arm)  Vitals Entered By: Laurance Flatten CMA (Feb 02, 2010 4:07 PM)  Physical Exam  General:  thin, anxious appearing Head:  normocephalic and atraumatic Eyes:  PERRLA/EOM intact; conjunctiva and lids normal. Mouth:  Teeth, gums and palate normal. Oral mucosa normal. Neck:  Neck supple, no JVD. No masses, thyromegaly or abnormal cervical nodes.  + L bruit Lungs:  Clear bilaterally to auscultation and percussion. Heart:  RRR, 3/6 SEM LUSB with is late peaking, S2 is not audible.  2/6 diastolic murmur LUSB, 2/6 holosystolic murmur at the apex Abdomen:  Bowel sounds positive; abdomen soft and non-tender without masses, organomegaly, or hernias noted. No hepatosplenomegaly. Msk:  Back normal, normal gait. Muscle strength and tone normal. Pulses:  pulses normal in all 4 extremities Extremities:  No clubbing or cyanosis. Neurologic:  Alert and oriented x 3. Skin:  Intact without lesions or rashes. Cervical Nodes:  no significant adenopathy Psych:  Normal affect.   EKG  Procedure date:  01/20/2009  Findings:      1. Left ventricle: There is moderate hypokinesis at the base of the    inferior wall. Wall thickness  was increased in a pattern of    moderate LVH. The estimated ejection fraction was = 60%. 2. Aortic valve: There was severe stenosis. The gradients are high.    Visually, there is significant calcification of the valve and    little motion is seen. Moderate regurgitation. 3. Mitral valve: Calcified annulus. Moderate regurgitation. 4.  Tricuspid valve: Moderate regurgitation. 5. Pulmonary arteries: PA peak pressure: 66mm Hg (S).  Nuclear Study  Procedure date:  01/20/2009  Findings:      No scar or ischemia.  Impression & Recommendations:  Problem # 1:  RHEUMATIC AORTIC STENOSIS WITH INSUFFICIENCY (ICD-395.2) The patient has symptomatic severe AS and moderate AI.  She has elevated PA pressures as well as moderate MR.  Upon prior visit to my office, she declined surgery.  At this point, she has developed symptoms of CHF as well as further syncope.  She is now more interested in surgery.  We will repeat her echo and also obtain RHC/LCH.  Once this information is available, she will be evaluated by Dr Tyrone Sage for surgerical consideration.  We will need to hold her coumadin prior to cath.  Problem # 2:  ATRIAL FIBRILLATION (ICD-427.31) She carries a h/o afib, though I do not have documentation of this. She is chronically anticoagulated for afib and h/o DVT.  She is in sinus rhythm today  Problem # 3:  ESSENTIAL HYPERTENSION, BENIGN (ICD-401.1) stable  Problem # 4:  SYNCOPE (ICD-780.2) she continues to have recurrent syncope, which may be due to her aortic stenosis.  A prior holter monitor was uneventful. She is referred for surgical consultation regarding her AS.  Other Orders: TLB-CBC Platelet - w/Differential (85025-CBCD) TLB-PT (Protime) (85610-PTP) TLB-PTT (85730-PTTL) TLB-BMP (Basic Metabolic Panel-BMET) (80048-METABOL) Echocardiogram (Echo) Misc. Referral (Misc. Ref)  Patient Instructions: 1)  Your physician has requested that you have an echocardiogram.  Echocardiography is a painless test that uses sound waves to create images of your heart. It provides your doctor with information about the size and shape of your heart and how well your heart's chambers and valves are working.  This procedure takes approximately one hour. There are no restrictions for this procedure. 2)  Your physician has requested that  you have a cardiac catheterization.  Cardiac catheterization is used to diagnose and/or treat various heart conditions. Doctors may recommend this procedure for a number of different reasons. The most common reason is to evaluate chest pain. Chest pain can be a symptom of coronary artery disease (CAD), and cardiac catheterization can show whether plaque is narrowing or blocking your heart's arteries. This procedure is also used to evaluate the valves, as well as measure the blood flow and oxygen levels in different parts of your heart.  For further information please visit [email or web address]  Please follow instruction sheet, as given.  Appended Document: f1y/g d We will also obtain presurgical carotid dopplers to evaluate her bruit.

## 2010-10-13 NOTE — Medication Information (Signed)
Summary: Coumadin Clinic  Anticoagulant Therapy  Managed by: Weston Brass, PharmD Referring MD: Hillis Range, MD PCP: Gordy Levan Supervising MD: Riley Kill MD, Maisie Fus Indication 1: Mechanical Prosthetic Heart Valve (prevent systemic emboli) Indication 2: Atrial Fibrillation Valve Type: Bioprosthetic INR POC 1.9 INR RANGE 2.0-3.0  Dietary changes: no    Health status changes: no    Bleeding/hemorrhagic complications: no    Recent/future hospitalizations: no    Any changes in medication regimen? no    Recent/future dental: no  Any missed doses?: no       Is patient compliant with meds? yes       Allergies: 1)  ! * Hydrrocodone 2)  ! Crestor  Anticoagulation Management History:      The patient is taking warfarin and comes in today for a routine follow up visit.  Positive risk factors for bleeding include an age of 75 years or older and presence of serious comorbidities.  The bleeding index is 'intermediate risk'.  Positive CHADS2 values include History of HTN and Age > 62 years old.  Anticoagulation responsible provider: Riley Kill MD, Maisie Fus.  INR POC: 1.9.    Anticoagulation Management Assessment/Plan:      The patient's current anticoagulation dose is Warfarin sodium 2 mg tabs: Use as directed by Anticoagualtion Clinic.  The target INR is 2.0-3.0.  The next INR is due 09/28/2010.  Anticoagulation instructions were given to spouse.  Results were reviewed/authorized by Weston Brass, PharmD.  She was notified by Weston Brass PharmD.         Prior Anticoagulation Instructions: INR 2.1  Spoke with pt's husband.  Continue same dose of 2 mg daily.  Recheck INR in 4 weeks.  Orders given to [place] with [facility].   Current Anticoagulation Instructions: INR 1.9  Spoke with pt's husband.  Take 3mg  today then resume same dose of 2mg  daily.  Recheck INR in 3 weeks.   Orders given to [place] with [facility]

## 2010-10-13 NOTE — Medication Information (Signed)
Summary: Coumadin Clinic  Anticoagulant Therapy  Managed by: Weston Brass, PharmD Referring MD: Hillis Range, MD PCP: Gordy Levan Supervising MD: Eden Emms MD, Theron Arista Indication 1: Mechanical Prosthetic Heart Valve (prevent systemic emboli) Indication 2: Atrial Fibrillation Valve Type: Bioprosthetic INR POC 1.9 INR RANGE 2.0-3.0  Dietary changes: no    Health status changes: no    Bleeding/hemorrhagic complications: no    Recent/future hospitalizations: no    Any changes in medication regimen? no    Recent/future dental: no  Any missed doses?: no       Is patient compliant with meds? yes       Allergies: 1)  ! * Hydrrocodone 2)  ! Crestor  Anticoagulation Management History:      Her anticoagulation is being managed by telephone today.  Positive risk factors for bleeding include an age of 76 years or older and presence of serious comorbidities.  The bleeding index is 'intermediate risk'.  Positive CHADS2 values include History of HTN and Age > 68 years old.  Anticoagulation responsible provider: Eden Emms MD, Theron Arista.  INR POC: 1.9.    Anticoagulation Management Assessment/Plan:      The patient's current anticoagulation dose is Warfarin sodium 2 mg tabs: Use as directed by Anticoagualtion Clinic.  The target INR is 2.0-3.0.  The next INR is due 10/19/2010.  Anticoagulation instructions were given to spouse.  Results were reviewed/authorized by Weston Brass, PharmD.  She was notified by Weston Brass PharmD.         Prior Anticoagulation Instructions: INR 1.5  Take  1 1/2 tablets (3mg ) today and tomorrow then resume same dose of 1 tablet (2mg ) every day.  Recheck INR in 1 week.  Orders given to [place] with [facility].   Current Anticoagulation Instructions: INR 1.9  Spoke with pt's husband.  Increase dose to 1 tablet every day excpet 1 1/2 tablets on Thursday.  Recheck INR in 2 weeks.  Orders given to [place] with [facility].

## 2010-10-13 NOTE — Assessment & Plan Note (Signed)
Summary: 2 [email or web address]   Visit Type:  Follow-up Primary Provider:  Gordy Levan  CC:  "tired".  History of Present Illness: The patient presents today for routine cardiology followup. She continues to make improvements following aortic valve surgery.  She has had no further syncope since her valve was replaced.  Her digital necrosis continues to improve.  The patient denies symptoms of palpitations, chest pain, shortness of breath, orthopnea, PND, lower extremity edema, dizziness, presyncope, syncope, fevers, chills, or neurologic sequela.  Overall, she seems to be doing well.  The patient is tolerating medications without difficulties and is otherwise without complaint today.   Current Medications (verified): 1)  Potassium Chloride Cr 10 Meq Cr-Caps (Potassium Chloride) .... Take One Tablet By Mouth Daily 2)  Digoxin 0.25 Mg Tabs (Digoxin) .... Take 1/2  Tablet By Mouth Daily 3)  Warfarin Sodium 2 Mg Tabs (Warfarin Sodium) .... Use As Directed By Anticoagualtion Clinic 4)  Furosemide 20 Mg Tabs (Furosemide) .... Take One Tablet By Mouth Day 5)  Fenofibrate 160 Mg Tabs (Fenofibrate) .... Take One Tablet By Mouth Once Daily. 6)  Aspirin 81 Mg Tbec (Aspirin) .... Take One Tablet By Mouth Daily 7)  Pletal 100 Mg Tabs (Cilostazol) .... Take 1 Tablet By Mouth Two Times A Day 8)  Cardizem Cd 180 Mg Xr24h-Cap (Diltiazem Hcl Coated Beads) .... Take 1 Tablet By Mouth Once A Day 9)  Metoprolol Tartrate 100 Mg Tabs (Metoprolol Tartrate) .... Take One Tablet By Mouth Twice A Day 10)  Protonix 40 Mg Tbec (Pantoprazole Sodium) .... Take 1 Tablet By Mouth Once Daily 11)  Silver Sulfadiazine 1 % Crea (Silver Sulfadiazine) .... Daily 12)  Ultram 50 Mg Tabs (Tramadol Hcl) .... As Needed 13)  Boost Plus  Liqd (Nutritional Supplements) .... As Needed 14)  Fe Plus Protein 25 Mg Tabs (Iron Combinations) .... Take One Two Times A Day 15)  B12 Injectiion .... Monthly  Allergies (verified): 1)  ! *  Hydrrocodone 2)  ! Crestor  Past History:  Past Medical History: Reviewed history from 07/20/2010 and no changes required. Critical aortic stenosis, moderate aortic regurgitation s/p AVR by Dr Tyrone Sage 7/11 Paroxysmal atrial fibrillation PACs, PVCs Hypertension Chronic Dizziness GERD HL DVT, chronically anticoagulated with coumadin Emphysema Breast Cancer (Dx 2005), mastectomy and chemotherapy  Past Surgical History: Appendectomy 02/14/06 L Mastectomy 2005 for BrCa Aortic Valve replacement 7/11 by Dr Tyrone Sage with a   #21 pericardial tissue valve [facility], model #3300 TFX, serial number [id number].   Social History: Reviewed history from 01/06/2009 and no changes required. Married and lives in [place].  Retired after working at [employer]. Tob- quit x 30 years.  ETOH- none.  Denies drugs.  Review of Systems       All systems are reviewed and negative except as listed in the HPI.   Vital Signs:  Patient profile:   77 year old female Height:      64 inches Weight:      98 pounds Pulse rate:   88 / minute BP sitting:   156 / 85  (right arm)  Vitals Entered By: Jacquelin Hawking, CMA (September 28, 2010 3:52 PM)  Physical Exam  General:  thin and frail Head:  normocephalic and atraumatic Eyes:  PERRLA/EOM intact; conjunctiva and lids normal. Mouth:  Teeth, gums and palate normal. Oral mucosa normal. Neck:  supple Chest Wall:  sternal wound is healed Lungs:  Clear bilaterally to auscultation and percussion. Heart:  RRR, 3/6 SEM LUSB  with is early peaking,  2/6 holosystolic murmur at the apex Abdomen:  Bowel sounds positive; abdomen soft and non-tender without masses, organomegaly, or hernias noted. No hepatosplenomegaly. Msk:  Back normal, normal gait. Muscle strength and tone normal.   Extremities:  No clubbing or cyanosis.  digital necrosis appears much improved Neurologic:  Alert and oriented x 3. Skin:  Intact without lesions or rashes. Psych:   Normal affect.   Impression & Recommendations:  Problem # 1:  AORTIC VALVE DISORDERS (ICD-424.1) doing well s/p AVR (tissue valve) I reviewed today's echo results with the patient and her spouse,  the valve appears to be functioning appropirately.  Mild AI and moderate MR with preserved EF is noted.   cardiac rehab is recommended  Problem # 2:  ATRIAL FIBRILLATION (ICD-427.31) subtherapeutic INR continue coumadin continue metoprolol, cardizem, and digoxin  given anemia, we will likely stop aspirin upon return  Problem # 3:  ESSENTIAL HYPERTENSION, BENIGN (ICD-401.1) above goal, though patient's spouse is very clear that she has good BP control at home salt restriction no changes today  Problem # 4:  SYNCOPE (ICD-780.2) resolved  Problem # 5:  ANEMIA ASSOCIATED W/OTHER SPEC NUTRITIONAL DEFIC (ICD-281.8) per PCP she is taking B12 and iron consider stopping ASA upon return  Patient Instructions: 1)  Your physician recommends that you schedule a follow-up appointment in: 2 months with Dr Johney Frame 2)  Your physician recommends referral and attendance at a Cardiac Rehab Program.

## 2010-10-13 NOTE — Medication Information (Signed)
Summary: Coumadin Clinic  Anticoagulant Therapy  Managed by: Weston Brass, PharmD Referring MD: Hillis Range, MD PCP: Gordy Levan Supervising MD: Graciela Husbands MD, Viviann Spare Indication 1: Mechanical Prosthetic Heart Valve (prevent systemic emboli) Indication 2: Atrial Fibrillation Valve Type: Bioprosthetic INR POC 1.5 INR RANGE 2.0-3.0  Dietary changes: yes       Details: ate extra cabbage  Health status changes: no    Bleeding/hemorrhagic complications: no    Recent/future hospitalizations: no    Any changes in medication regimen? no    Recent/future dental: no  Any missed doses?: no       Is patient compliant with meds? yes       Allergies: 1)  ! * Hydrrocodone 2)  ! Crestor  Anticoagulation Management History:      The patient is taking warfarin and comes in today for a routine follow up visit.  Positive risk factors for bleeding include an age of 75 years or older and presence of serious comorbidities.  The bleeding index is 'intermediate risk'.  Positive CHADS2 values include History of HTN and Age > 75 years old.  Anticoagulation responsible provider: Graciela Husbands MD, Viviann Spare.  INR POC: 1.5.    Anticoagulation Management Assessment/Plan:      The patient's current anticoagulation dose is Warfarin sodium 2 mg tabs: Use as directed by Anticoagualtion Clinic.  The target INR is 2.0-3.0.  The next INR is due 10/05/2010.  Anticoagulation instructions were given to spouse.  Results were reviewed/authorized by Weston Brass, PharmD.  She was notified by Weston Brass PharmD.         Prior Anticoagulation Instructions: INR 1.9  Spoke with pt's husband.  Take 3mg  today then resume same dose of 2mg  daily.  Recheck INR in 3 weeks.   Orders given to [place] with [facility].   Current Anticoagulation Instructions: INR 1.5  Take  1 1/2 tablets (3mg ) today and tomorrow then resume same dose of 1 tablet (2mg ) every day.  Recheck INR in 1 week.  Orders given to [place] with [facility].

## 2010-10-19 ENCOUNTER — Encounter: Payer: Self-pay | Admitting: Internal Medicine

## 2010-10-26 ENCOUNTER — Encounter: Payer: Self-pay | Admitting: Internal Medicine

## 2010-10-27 ENCOUNTER — Encounter: Payer: Self-pay | Admitting: Internal Medicine

## 2010-10-27 NOTE — Medication Information (Signed)
Summary: Coumadin Clinic  Anticoagulant Therapy  Managed by: Weston Brass, PharmD Referring MD: Hillis Range, MD PCP: Gordy Levan Supervising MD: Graciela Husbands MD, Viviann Spare Indication 1: Mechanical Prosthetic Heart Valve (prevent systemic emboli) Indication 2: Atrial Fibrillation Valve Type: Bioprosthetic INR POC 1.4 INR RANGE 2.0-3.0  Dietary changes: no    Health status changes: no    Bleeding/hemorrhagic complications: no    Recent/future hospitalizations: no    Any changes in medication regimen? no    Recent/future dental: no  Any missed doses?: yes     Details: last thursday  Is patient compliant with meds? yes       Allergies: 1)  ! * Hydrrocodone 2)  ! Crestor  Anticoagulation Management History:      Her anticoagulation is being managed by telephone today.  Positive risk factors for bleeding include an age of 75 years or older and presence of serious comorbidities.  The bleeding index is 'intermediate risk'.  Positive CHADS2 values include History of HTN and Age > 11 years old.  Anticoagulation responsible provider: Graciela Husbands MD, Viviann Spare.  INR POC: 1.4.    Anticoagulation Management Assessment/Plan:      The patient's current anticoagulation dose is Warfarin sodium 2 mg tabs: Use as directed by Anticoagualtion Clinic.  The target INR is 2.0-3.0.  The next INR is due 10/26/2010.  Anticoagulation instructions were given to spouse.  Results were reviewed/authorized by Weston Brass, PharmD.         Prior Anticoagulation Instructions: INR 1.9  Spoke with pt's husband.  Increase dose to 1 tablet every day excpet 1 1/2 tablets on Thursday.  Recheck INR in 2 weeks.  Orders given to [place] with [facility].   Current Anticoagulation Instructions: INR 1.4   Spoke to patient's husband. Take 1 and 1/2 tablets today. Then begin taking 1 tablet everyday except 1 and 1/2 tablets Mondays and Thursdays. Recheck INR in 1 week. Orders given to [place] with [place] [facility].

## 2010-11-02 LAB — CONVERTED CEMR LAB: POC INR: 2

## 2010-11-02 NOTE — Progress Notes (Signed)
Summary: [facility]: Office Visit  [facility]: Office Visit   Imported By: Earl Many 10/24/2010 18:31:16  _____________________________________________________________________  External Attachment:    Type:   Image     Comment:   External Document

## 2010-11-02 NOTE — Miscellaneous (Signed)
Summary: [facility] Doctor's Orders   [facility] Doctor's Orders   Imported By: Roderic Ovens 10/28/2010 14:52:24  _____________________________________________________________________  External Attachment:    Type:   Image     Comment:   External Document

## 2010-11-03 ENCOUNTER — Encounter (INDEPENDENT_AMBULATORY_CARE_PROVIDER_SITE_OTHER): Payer: Medicare Other | Admitting: Cardiothoracic Surgery

## 2010-11-03 ENCOUNTER — Encounter: Payer: Self-pay | Admitting: Internal Medicine

## 2010-11-03 ENCOUNTER — Encounter (INDEPENDENT_AMBULATORY_CARE_PROVIDER_SITE_OTHER): Payer: Medicare Other

## 2010-11-03 DIAGNOSIS — I4891 Unspecified atrial fibrillation: Secondary | ICD-10-CM

## 2010-11-03 DIAGNOSIS — I359 Nonrheumatic aortic valve disorder, unspecified: Secondary | ICD-10-CM

## 2010-11-03 DIAGNOSIS — I059 Rheumatic mitral valve disease, unspecified: Secondary | ICD-10-CM

## 2010-11-03 DIAGNOSIS — Z7901 Long term (current) use of anticoagulants: Secondary | ICD-10-CM

## 2010-11-04 NOTE — Assessment & Plan Note (Signed)
OFFICE VISIT  Savannah Roberts, Savannah Roberts M DOB:  [id number]                                        November 03, 2010 CHART #:  [id number]  The patient comes to the office today after aortic valve replacement with a #21 pericardial tissue valve in March 29, 2010.  Preoperatively, very frail individual who had a prolonged recovery period.  Including episodes of necrotic toes and fingers, but ultimately has improved back to functional status at home, now off oxygen.  Her fingers and toes have all recovered with exception of the great and second toe on the right foot which continues to improve.  She had a recent echo done in [place] office and had mild-to-moderate mitral regurgitation and mild aortic insufficiency.  From a functional status, the patient continues to improve and without overt symptoms of congestive heart failure.  She is ready at this point to start in cardiac rehab program in [place].  PHYSICAL EXAMINATION:  On exam, her blood pressure 154/70, pulse is 80, respiratory rate 16, O2 sats 99% on room air.  Sternum is stable and well-healed.  Bowel sounds are crisp.  I do not appreciate a murmur of aortic insufficiency.  She does have a murmur of mitral insufficiency. She has no pedal edema.  It is noted the great and second toe on her right foot still have some black necrotic areas, but without active infection and these appear to be healing and are much improved.  The toes on the other foot and on her right foot now completely healed.  The patient continues on potassium, digoxin, Coumadin, Lasix, fenofibrate, aspirin, Pletal, Cardizem, Lopressor, Protonix, Ultram, iron, and B12.  I will plan to see her back in 3-4 months. She is to see Dr. Johney Frame, next month.  We will make a referral to the [facility] to get her involved in rehab there.  Sheliah Plane, MD Electronically Signed  EG/MEDQ  D:  11/03/2010  T:  11/04/2010  Job:  [id number]  cc:    Hillis Range, MD Dr. Roney Marion

## 2010-11-08 NOTE — Medication Information (Signed)
Summary: rov/tm  Anticoagulant Therapy  Managed by: Bethena Midget, RN, BSN Referring MD: Hillis Range, MD PCP: Gordy Levan Supervising MD: Ladona Ridgel MD, Sharlot Gowda Indication 1: Mechanical Prosthetic Heart Valve (prevent systemic emboli) Indication 2: Atrial Fibrillation Valve Type: Bioprosthetic Lab Used: [facility] INR POC 2.2 INR RANGE 2.0-3.0  Dietary changes: no    Health status changes: no    Bleeding/hemorrhagic complications: no    Recent/future hospitalizations: no    Any changes in medication regimen? no    Recent/future dental: no  Any missed doses?: no       Is patient compliant with meds? yes       Allergies: 1)  ! * Hydrrocodone 2)  ! Crestor  Anticoagulation Management History:      The patient is taking warfarin and comes in today for a routine follow up visit.  Positive risk factors for bleeding include an age of 24 years or older and presence of serious comorbidities.  The bleeding index is 'intermediate risk'.  Positive CHADS2 values include History of HTN and Age > 53 years old.  Anticoagulation responsible provider: Ladona Ridgel MD, Sharlot Gowda.  INR POC: 2.2.  Cuvette Lot#: [id number].  Exp: 10/2011.    Anticoagulation Management Assessment/Plan:      The patient's current anticoagulation dose is Warfarin sodium 2 mg tabs: Use as directed by Anticoagualtion Clinic.  The target INR is 2.0-3.0.  The next INR is due 11/14/2010.  Anticoagulation instructions were given to spouse.  Results were reviewed/authorized by Bethena Midget, RN, BSN.  She was notified by Bethena Midget, RN, BSN.         Prior Anticoagulation Instructions: INR 2.0  Called spoke with pt's husband advised to continue on same dosage 2mg  daily except 3mg  on Mondays and Thursdays.  Recheck in 1 week.  Yvetta Coder, RN with [facility] gave verbal orders to check PT/INR in 1 week on 11/02/10.  Current Anticoagulation Instructions: INR 2.2 Continue 2mg s everyday except 3mg s on Mondays and  Thursdays. Recheck in 2 weeks.

## 2010-11-11 ENCOUNTER — Encounter: Payer: Self-pay | Admitting: Internal Medicine

## 2010-11-14 ENCOUNTER — Ambulatory Visit (INDEPENDENT_AMBULATORY_CARE_PROVIDER_SITE_OTHER): Payer: Medicare Other | Admitting: Internal Medicine

## 2010-11-14 ENCOUNTER — Encounter: Payer: Self-pay | Admitting: Cardiology

## 2010-11-14 ENCOUNTER — Encounter: Payer: Self-pay | Admitting: Internal Medicine

## 2010-11-14 ENCOUNTER — Encounter (INDEPENDENT_AMBULATORY_CARE_PROVIDER_SITE_OTHER): Payer: Medicare Other

## 2010-11-14 DIAGNOSIS — I4891 Unspecified atrial fibrillation: Secondary | ICD-10-CM

## 2010-11-14 DIAGNOSIS — I359 Nonrheumatic aortic valve disorder, unspecified: Secondary | ICD-10-CM

## 2010-11-14 DIAGNOSIS — Z7901 Long term (current) use of anticoagulants: Secondary | ICD-10-CM

## 2010-11-14 DIAGNOSIS — I10 Essential (primary) hypertension: Secondary | ICD-10-CM

## 2010-11-22 NOTE — Assessment & Plan Note (Signed)
Summary: 2 MONTH ROV 2 11:45 PER KELLY/SL/NR   Visit Type:  Follow-up Primary Provider:  Dr.Jackline Castilla [place]   History of Present Illness: The patient presents today for routine cardiology followup. She continues to make improvements following aortic valve surgery.  She has had no further syncope since her valve was replaced.  Her digital necrosis continues to improve.   Her primary concern today is with fatigue.  The patient denies symptoms of palpitations, chest pain, shortness of breath, orthopnea, PND, lower extremity edema, dizziness, presyncope, syncope, fevers, chills, or neurologic sequela.  Overall, she seems to be doing well.  The patient is tolerating medications without difficulties and is otherwise without complaint today.   Current Medications (verified): 1)  Potassium Chloride Cr 10 Meq Cr-Caps (Potassium Chloride) .... Take One Tablet By Mouth Daily 2)  Digoxin 0.25 Mg Tabs (Digoxin) .... Take 1/2  Tablet By Mouth Daily 3)  Warfarin Sodium 2 Mg Tabs (Warfarin Sodium) .... Use As Directed By Anticoagualtion Clinic 4)  Furosemide 20 Mg Tabs (Furosemide) .... Take One Tablet By Mouth Day 5)  Fenofibrate 160 Mg Tabs (Fenofibrate) .... Take One Tablet By Mouth Once Daily. 6)  Aspirin 81 Mg Tbec (Aspirin) .... Take One Tablet By Mouth Daily 7)  Pletal 100 Mg Tabs (Cilostazol) .... Take 1 Tablet By Mouth Two Times A Day 8)  Cardizem Cd 180 Mg Xr24h-Cap (Diltiazem Hcl Coated Beads) .... Take 1 Tablet By Mouth Once A Day 9)  Metoprolol Tartrate 100 Mg Tabs (Metoprolol Tartrate) .... Take One Tablet By Mouth Twice A Day 10)  Silver Sulfadiazine 1 % Crea (Silver Sulfadiazine) .... Daily 11)  Ultram 50 Mg Tabs (Tramadol Hcl) .... As Needed 12)  Boost Plus  Liqd (Nutritional Supplements) .... As Needed 13)  Fe Plus Protein 25 Mg Tabs (Iron Combinations) .... Take One Two Times A Day 14)  B12 Injectiion .... Monthly  Allergies: 1)  ! * Hydrrocodone 2)  ! Crestor  Past History:  Past  Medical History: Reviewed history from 07/20/2010 and no changes required. Critical aortic stenosis, moderate aortic regurgitation s/p AVR by Dr Tyrone Sage 7/11 Paroxysmal atrial fibrillation PACs, PVCs Hypertension Chronic Dizziness GERD HL DVT, chronically anticoagulated with coumadin Emphysema Breast Cancer (Dx 2005), mastectomy and chemotherapy  Past Surgical History: Reviewed history from 09/28/2010 and no changes required. Appendectomy 02/14/06 L Mastectomy 2005 for BrCa Aortic Valve replacement 7/11 by Dr Tyrone Sage with a   #21 pericardial tissue valve [facility], model #3300 TFX, serial number [id number].   Social History: Reviewed history from 01/06/2009 and no changes required. Married and lives in [place].  Retired after working at [employer]. Tob- quit x 30 years.  ETOH- none.  Denies drugs.  Review of Systems       All systems are reviewed and negative except as listed in the HPI.   Vital Signs:  Patient profile:   77 year old female Height:      64 inches Weight:      100 pounds BMI:     17.23 Pulse rate:   73 / minute BP sitting:   128 / 72  (right arm)  Vitals Entered By: Laurance Flatten CMA (November 14, 2010 11:49 AM)  Physical Exam  General:  thin, NAD Head:  normocephalic and atraumatic Eyes:  PERRLA/EOM intact; conjunctiva and lids normal. Mouth:  Teeth, gums and palate normal. Oral mucosa normal. Neck:  supple Lungs:  Clear bilaterally to auscultation and percussion. Heart:  RRR, 3/6 SEM  LUSB with is early peaking,  2/6 holosystolic murmur at the apex Abdomen:  Bowel sounds positive; abdomen soft and non-tender without masses, organomegaly, or hernias noted. No hepatosplenomegaly. Msk:  Back normal, normal gait. Muscle strength and tone normal.   Extremities:  No clubbing or cyanosis.  digital necrosis appears much improved Neurologic:  Alert and oriented x 3.   EKG  Procedure date:  11/14/2010  Findings:      sinus rhythm 73  bpm, LVH  Impression & Recommendations:  Problem # 1:  AORTIC VALVE DISORDERS (ICD-424.1) doing well s/p AVR (tissue valve) I reviewed today's echo results with the patient and her spouse,  the valve appears to be functioning appropirately.  Mild AI and moderate MR with preserved EF is noted. she plans to start cardiac rehab soon  Problem # 2:  ATRIAL FIBRILLATION (ICD-427.31) maintaining sinus rhythm continue lifelong coumadin  given fatigue, we will stop digoxin and decrease metoprolol to 50mg  two times a day   Problem # 3:  ESSENTIAL HYPERTENSION, BENIGN (ICD-401.1) stable  Problem # 4:  SYNCOPE (ICD-780.2) resolved s/p AVR  Other Orders: EKG w/ Interpretation (93000)  Patient Instructions: 1)  Your physician recommends that you schedule a follow-up appointment in: 3 months with Dr Johney Frame 2)  stop Digoxin 3)  Decrease Metoprolol to 50mg  two times a day

## 2010-11-22 NOTE — Letter (Signed)
Summary: [facility]   Imported By: Marylou Mccoy 11/14/2010 15:06:27  _____________________________________________________________________  External Attachment:    Type:   Image     Comment:   External Document

## 2010-11-22 NOTE — Medication Information (Signed)
Summary: rov/tm  Anticoagulant Therapy  Managed by: Bethena Midget, RN, BSN Referring MD: Hillis Range, MD PCP: Gordy Levan Supervising MD: Daleen Squibb MD, Maisie Fus Indication 1: Mechanical Prosthetic Heart Valve (prevent systemic emboli) Indication 2: Atrial Fibrillation Valve Type: Bioprosthetic Lab Used: [facility] INR POC 1.9 INR RANGE 2.0-3.0  Dietary changes: no    Health status changes: yes       Details: Lack of energy  Bleeding/hemorrhagic complications: no    Recent/future hospitalizations: no    Any changes in medication regimen? no    Recent/future dental: no  Any missed doses?: no       Is patient compliant with meds? yes      Comments: Seeing Dr Johney Frame today.   Allergies: 1)  ! * Hydrrocodone 2)  ! Crestor  Anticoagulation Management History:      The patient is taking warfarin and comes in today for a routine follow up visit.  Positive risk factors for bleeding include an age of 75 years or older and presence of serious comorbidities.  The bleeding index is 'intermediate risk'.  Positive CHADS2 values include History of HTN and Age > 61 years old.  Anticoagulation responsible provider: Daleen Squibb MD, Maisie Fus.  INR POC: 1.9.  Cuvette Lot#: [id number].  Exp: 09/2011.    Anticoagulation Management Assessment/Plan:      The patient's current anticoagulation dose is Warfarin sodium 2 mg tabs: Use as directed by Anticoagualtion Clinic.  The target INR is 2.0-3.0.  The next INR is due 11/28/2010.  Anticoagulation instructions were given to spouse.  Results were reviewed/authorized by Bethena Midget, RN, BSN.  She was notified by Bethena Midget, RN, BSN.         Prior Anticoagulation Instructions: INR 2.2 Continue 2mg s everyday except 3mg s on Mondays and Thursdays. Recheck in 2 weeks.   Current Anticoagulation Instructions: INR 1.9 Today take 4mg s then resume 2mg s daily except 3mg s on Mondays and Thursdays. Recheck in 2 weeks.

## 2010-11-25 ENCOUNTER — Encounter: Payer: Self-pay | Admitting: Internal Medicine

## 2010-11-25 DIAGNOSIS — I4891 Unspecified atrial fibrillation: Secondary | ICD-10-CM

## 2010-11-25 LAB — PROTIME-INR
INR: 1.63 — ABNORMAL HIGH (ref 0.00–1.49)
INR: 1.64 — ABNORMAL HIGH (ref 0.00–1.49)
INR: 1.71 — ABNORMAL HIGH (ref 0.00–1.49)
INR: 1.73 — ABNORMAL HIGH (ref 0.00–1.49)
INR: 1.75 — ABNORMAL HIGH (ref 0.00–1.49)
INR: 1.75 — ABNORMAL HIGH (ref 0.00–1.49)
INR: 1.89 — ABNORMAL HIGH (ref 0.00–1.49)
INR: 1.95 — ABNORMAL HIGH (ref 0.00–1.49)
INR: 2.01 — ABNORMAL HIGH (ref 0.00–1.49)
INR: 2.11 — ABNORMAL HIGH (ref 0.00–1.49)
INR: 2.13 — ABNORMAL HIGH (ref 0.00–1.49)
INR: 2.19 — ABNORMAL HIGH (ref 0.00–1.49)
INR: 2.32 — ABNORMAL HIGH (ref 0.00–1.49)
INR: 2.41 — ABNORMAL HIGH (ref 0.00–1.49)
INR: 2.44 — ABNORMAL HIGH (ref 0.00–1.49)
Prothrombin Time: 19.5 seconds — ABNORMAL HIGH (ref 11.6–15.2)
Prothrombin Time: 19.3 seconds — ABNORMAL HIGH (ref 11.6–15.2)
Prothrombin Time: 19.9 seconds — ABNORMAL HIGH (ref 11.6–15.2)
Prothrombin Time: 20.4 seconds — ABNORMAL HIGH (ref 11.6–15.2)
Prothrombin Time: 20.6 seconds — ABNORMAL HIGH (ref 11.6–15.2)
Prothrombin Time: 20.6 seconds — ABNORMAL HIGH (ref 11.6–15.2)
Prothrombin Time: 21.9 seconds — ABNORMAL HIGH (ref 11.6–15.2)
Prothrombin Time: 22.4 seconds — ABNORMAL HIGH (ref 11.6–15.2)
Prothrombin Time: 22.9 seconds — ABNORMAL HIGH (ref 11.6–15.2)
Prothrombin Time: 23.8 seconds — ABNORMAL HIGH (ref 11.6–15.2)
Prothrombin Time: 24 seconds — ABNORMAL HIGH (ref 11.6–15.2)
Prothrombin Time: 24.5 seconds — ABNORMAL HIGH (ref 11.6–15.2)
Prothrombin Time: 25.6 seconds — ABNORMAL HIGH (ref 11.6–15.2)
Prothrombin Time: 26.4 seconds — ABNORMAL HIGH (ref 11.6–15.2)
Prothrombin Time: 26.6 seconds — ABNORMAL HIGH (ref 11.6–15.2)

## 2010-11-25 LAB — COMPREHENSIVE METABOLIC PANEL
ALT: 21 U/L (ref 0–35)
AST: 20 U/L (ref 0–37)
Albumin: 1.7 g/dL — ABNORMAL LOW (ref 3.5–5.2)
Alkaline Phosphatase: 68 U/L (ref 39–117)
BUN: 27 mg/dL — ABNORMAL HIGH (ref 6–23)
CO2: 32 mEq/L (ref 19–32)
Calcium: 8.9 mg/dL (ref 8.4–10.5)
Chloride: 103 mEq/L (ref 96–112)
Creatinine, Ser: 1.16 mg/dL (ref 0.4–1.2)
GFR calc Af Amer: 55 mL/min — ABNORMAL LOW (ref >60)
GFR calc non Af Amer: 45 mL/min — ABNORMAL LOW (ref >60)
Glucose, Bld: 115 mg/dL — ABNORMAL HIGH (ref 70–99)
Potassium: 3.5 mEq/L (ref 3.5–5.1)
Sodium: 144 mEq/L (ref 135–145)
Total Bilirubin: 1 mg/dL (ref 0.3–1.2)
Total Protein: 5.2 g/dL — ABNORMAL LOW (ref 6.0–8.3)

## 2010-11-25 LAB — BASIC METABOLIC PANEL
BUN: 20 mg/dL (ref 6–23)
BUN: 21 mg/dL (ref 6–23)
BUN: 23 mg/dL (ref 6–23)
BUN: 24 mg/dL — ABNORMAL HIGH (ref 6–23)
BUN: 24 mg/dL — ABNORMAL HIGH (ref 6–23)
BUN: 26 mg/dL — ABNORMAL HIGH (ref 6–23)
BUN: 27 mg/dL — ABNORMAL HIGH (ref 6–23)
BUN: 35 mg/dL — ABNORMAL HIGH (ref 6–23)
BUN: 37 mg/dL — ABNORMAL HIGH (ref 6–23)
BUN: 40 mg/dL — ABNORMAL HIGH (ref 6–23)
CO2: 31 mEq/L (ref 19–32)
CO2: 31 mEq/L (ref 19–32)
CO2: 32 mEq/L (ref 19–32)
CO2: 32 mEq/L (ref 19–32)
CO2: 32 mEq/L (ref 19–32)
CO2: 33 mEq/L — ABNORMAL HIGH (ref 19–32)
CO2: 33 mEq/L — ABNORMAL HIGH (ref 19–32)
CO2: 33 mEq/L — ABNORMAL HIGH (ref 19–32)
CO2: 33 mEq/L — ABNORMAL HIGH (ref 19–32)
CO2: 36 mEq/L — ABNORMAL HIGH (ref 19–32)
Calcium: 8.1 mg/dL — ABNORMAL LOW (ref 8.4–10.5)
Calcium: 8.5 mg/dL (ref 8.4–10.5)
Calcium: 8.6 mg/dL (ref 8.4–10.5)
Calcium: 8.8 mg/dL (ref 8.4–10.5)
Calcium: 8.8 mg/dL (ref 8.4–10.5)
Calcium: 8.9 mg/dL (ref 8.4–10.5)
Calcium: 9 mg/dL (ref 8.4–10.5)
Calcium: 9 mg/dL (ref 8.4–10.5)
Calcium: 9.1 mg/dL (ref 8.4–10.5)
Calcium: 9.2 mg/dL (ref 8.4–10.5)
Chloride: 100 mEq/L (ref 96–112)
Chloride: 100 mEq/L (ref 96–112)
Chloride: 101 mEq/L (ref 96–112)
Chloride: 102 mEq/L (ref 96–112)
Chloride: 102 mEq/L (ref 96–112)
Chloride: 105 mEq/L (ref 96–112)
Chloride: 106 mEq/L (ref 96–112)
Chloride: 96 mEq/L (ref 96–112)
Chloride: 97 mEq/L (ref 96–112)
Chloride: 97 mEq/L (ref 96–112)
Creatinine, Ser: 0.93 mg/dL (ref 0.4–1.2)
Creatinine, Ser: 0.96 mg/dL (ref 0.4–1.2)
Creatinine, Ser: 1.03 mg/dL (ref 0.4–1.2)
Creatinine, Ser: 1.03 mg/dL (ref 0.4–1.2)
Creatinine, Ser: 1.05 mg/dL (ref 0.4–1.2)
Creatinine, Ser: 1.06 mg/dL (ref 0.4–1.2)
Creatinine, Ser: 1.15 mg/dL (ref 0.4–1.2)
Creatinine, Ser: 1.16 mg/dL (ref 0.4–1.2)
Creatinine, Ser: 1.19 mg/dL (ref 0.4–1.2)
Creatinine, Ser: 1.2 mg/dL (ref 0.4–1.2)
GFR calc Af Amer: 52 mL/min — ABNORMAL LOW (ref >60)
GFR calc Af Amer: 53 mL/min — ABNORMAL LOW (ref >60)
GFR calc Af Amer: 55 mL/min — ABNORMAL LOW (ref >60)
GFR calc Af Amer: 55 mL/min — ABNORMAL LOW (ref >60)
GFR calc Af Amer: >60 mL/min (ref >60)
GFR calc Af Amer: >60 mL/min (ref >60)
GFR calc Af Amer: >60 mL/min (ref >60)
GFR calc Af Amer: >60 mL/min (ref >60)
GFR calc Af Amer: >60 mL/min (ref >60)
GFR calc Af Amer: >60 mL/min (ref >60)
GFR calc non Af Amer: 43 mL/min — ABNORMAL LOW (ref >60)
GFR calc non Af Amer: 44 mL/min — ABNORMAL LOW (ref >60)
GFR calc non Af Amer: 45 mL/min — ABNORMAL LOW (ref >60)
GFR calc non Af Amer: 46 mL/min — ABNORMAL LOW (ref >60)
GFR calc non Af Amer: 50 mL/min — ABNORMAL LOW (ref >60)
GFR calc non Af Amer: 51 mL/min — ABNORMAL LOW (ref >60)
GFR calc non Af Amer: 52 mL/min — ABNORMAL LOW (ref >60)
GFR calc non Af Amer: 52 mL/min — ABNORMAL LOW (ref >60)
GFR calc non Af Amer: 56 mL/min — ABNORMAL LOW (ref >60)
GFR calc non Af Amer: 58 mL/min — ABNORMAL LOW (ref >60)
Glucose, Bld: 106 mg/dL — ABNORMAL HIGH (ref 70–99)
Glucose, Bld: 109 mg/dL — ABNORMAL HIGH (ref 70–99)
Glucose, Bld: 113 mg/dL — ABNORMAL HIGH (ref 70–99)
Glucose, Bld: 114 mg/dL — ABNORMAL HIGH (ref 70–99)
Glucose, Bld: 114 mg/dL — ABNORMAL HIGH (ref 70–99)
Glucose, Bld: 114 mg/dL — ABNORMAL HIGH (ref 70–99)
Glucose, Bld: 115 mg/dL — ABNORMAL HIGH (ref 70–99)
Glucose, Bld: 121 mg/dL — ABNORMAL HIGH (ref 70–99)
Glucose, Bld: 134 mg/dL — ABNORMAL HIGH (ref 70–99)
Glucose, Bld: 138 mg/dL — ABNORMAL HIGH (ref 70–99)
Potassium: 2.9 mEq/L — ABNORMAL LOW (ref 3.5–5.1)
Potassium: 3 mEq/L — ABNORMAL LOW (ref 3.5–5.1)
Potassium: 3.1 mEq/L — ABNORMAL LOW (ref 3.5–5.1)
Potassium: 3.2 mEq/L — ABNORMAL LOW (ref 3.5–5.1)
Potassium: 3.4 mEq/L — ABNORMAL LOW (ref 3.5–5.1)
Potassium: 3.5 mEq/L (ref 3.5–5.1)
Potassium: 3.5 mEq/L (ref 3.5–5.1)
Potassium: 3.8 mEq/L (ref 3.5–5.1)
Potassium: 3.8 mEq/L (ref 3.5–5.1)
Potassium: 4 mEq/L (ref 3.5–5.1)
Sodium: 138 mEq/L (ref 135–145)
Sodium: 138 mEq/L (ref 135–145)
Sodium: 138 mEq/L (ref 135–145)
Sodium: 140 mEq/L (ref 135–145)
Sodium: 142 mEq/L (ref 135–145)
Sodium: 142 mEq/L (ref 135–145)
Sodium: 143 mEq/L (ref 135–145)
Sodium: 144 mEq/L (ref 135–145)
Sodium: 144 mEq/L (ref 135–145)
Sodium: 146 mEq/L — ABNORMAL HIGH (ref 135–145)

## 2010-11-25 LAB — GLUCOSE, CAPILLARY
Glucose-Capillary: 111 mg/dL — ABNORMAL HIGH (ref 70–99)
Glucose-Capillary: 120 mg/dL — ABNORMAL HIGH (ref 70–99)
Glucose-Capillary: 110 mg/dL — ABNORMAL HIGH (ref 70–99)
Glucose-Capillary: 112 mg/dL — ABNORMAL HIGH (ref 70–99)
Glucose-Capillary: 113 mg/dL — ABNORMAL HIGH (ref 70–99)
Glucose-Capillary: 116 mg/dL — ABNORMAL HIGH (ref 70–99)
Glucose-Capillary: 124 mg/dL — ABNORMAL HIGH (ref 70–99)
Glucose-Capillary: 136 mg/dL — ABNORMAL HIGH (ref 70–99)
Glucose-Capillary: 140 mg/dL — ABNORMAL HIGH (ref 70–99)
Glucose-Capillary: 141 mg/dL — ABNORMAL HIGH (ref 70–99)
Glucose-Capillary: 143 mg/dL — ABNORMAL HIGH (ref 70–99)
Glucose-Capillary: 149 mg/dL — ABNORMAL HIGH (ref 70–99)

## 2010-11-25 LAB — CROSSMATCH
ABO/RH(D): A POS
Antibody Screen: NEGATIVE

## 2010-11-25 LAB — BRAIN NATRIURETIC PEPTIDE
Pro B Natriuretic peptide (BNP): 1199 pg/mL — ABNORMAL HIGH (ref 0.0–100.0)
Pro B Natriuretic peptide (BNP): 1475 pg/mL — ABNORMAL HIGH (ref 0.0–100.0)
Pro B Natriuretic peptide (BNP): 821 pg/mL — ABNORMAL HIGH (ref 0.0–100.0)

## 2010-11-25 LAB — CBC
HCT: 31.5 % — ABNORMAL LOW (ref 36.0–46.0)
HCT: 23.2 % — ABNORMAL LOW (ref 36.0–46.0)
HCT: 24.3 % — ABNORMAL LOW (ref 36.0–46.0)
HCT: 24.4 % — ABNORMAL LOW (ref 36.0–46.0)
HCT: 24.6 % — ABNORMAL LOW (ref 36.0–46.0)
HCT: 27.4 % — ABNORMAL LOW (ref 36.0–46.0)
HCT: 28.2 % — ABNORMAL LOW (ref 36.0–46.0)
HCT: 28.7 % — ABNORMAL LOW (ref 36.0–46.0)
HCT: 29.9 % — ABNORMAL LOW (ref 36.0–46.0)
HCT: 31.1 % — ABNORMAL LOW (ref 36.0–46.0)
Hemoglobin: 9.9 g/dL — ABNORMAL LOW (ref 12.0–15.0)
Hemoglobin: 7.3 g/dL — ABNORMAL LOW (ref 12.0–15.0)
Hemoglobin: 7.8 g/dL — ABNORMAL LOW (ref 12.0–15.0)
Hemoglobin: 7.9 g/dL — ABNORMAL LOW (ref 12.0–15.0)
Hemoglobin: 8.1 g/dL — ABNORMAL LOW (ref 12.0–15.0)
Hemoglobin: 8.7 g/dL — ABNORMAL LOW (ref 12.0–15.0)
Hemoglobin: 8.9 g/dL — ABNORMAL LOW (ref 12.0–15.0)
Hemoglobin: 9 g/dL — ABNORMAL LOW (ref 12.0–15.0)
Hemoglobin: 9.4 g/dL — ABNORMAL LOW (ref 12.0–15.0)
Hemoglobin: 9.6 g/dL — ABNORMAL LOW (ref 12.0–15.0)
MCH: 28.1 pg (ref 26.0–34.0)
MCH: 28.1 pg (ref 26.0–34.0)
MCH: 28.2 pg (ref 26.0–34.0)
MCH: 28.3 pg (ref 26.0–34.0)
MCH: 28.5 pg (ref 26.0–34.0)
MCH: 28.6 pg (ref 26.0–34.0)
MCH: 28.7 pg (ref 26.0–34.0)
MCH: 28.9 pg (ref 26.0–34.0)
MCH: 29.3 pg (ref 26.0–34.0)
MCH: 30.1 pg (ref 26.0–34.0)
MCHC: 31.4 g/dL (ref 30.0–36.0)
MCHC: 30.9 g/dL (ref 30.0–36.0)
MCHC: 30.9 g/dL (ref 30.0–36.0)
MCHC: 31 g/dL (ref 30.0–36.0)
MCHC: 31.4 g/dL (ref 30.0–36.0)
MCHC: 31.5 g/dL (ref 30.0–36.0)
MCHC: 32 g/dL (ref 30.0–36.0)
MCHC: 32.1 g/dL (ref 30.0–36.0)
MCHC: 32.8 g/dL (ref 30.0–36.0)
MCHC: 33.5 g/dL (ref 30.0–36.0)
MCV: 89.5 fL (ref 78.0–100.0)
MCV: 89.1 fL (ref 78.0–100.0)
MCV: 89.3 fL (ref 78.0–100.0)
MCV: 89.7 fL (ref 78.0–100.0)
MCV: 89.9 fL (ref 78.0–100.0)
MCV: 90 fL (ref 78.0–100.0)
MCV: 90.6 fL (ref 78.0–100.0)
MCV: 90.9 fL (ref 78.0–100.0)
MCV: 91.3 fL (ref 78.0–100.0)
MCV: 93.2 fL (ref 78.0–100.0)
Platelets: 221 10*3/uL (ref 150–400)
Platelets: 242 10*3/uL (ref 150–400)
Platelets: 253 10*3/uL (ref 150–400)
Platelets: 255 10*3/uL (ref 150–400)
Platelets: 300 10*3/uL (ref 150–400)
Platelets: 331 10*3/uL (ref 150–400)
Platelets: 334 10*3/uL (ref 150–400)
Platelets: 336 10*3/uL (ref 150–400)
Platelets: 351 10*3/uL (ref 150–400)
Platelets: 368 10*3/uL (ref 150–400)
RBC: 3.52 MIL/uL — ABNORMAL LOW (ref 3.87–5.11)
RBC: 2.58 MIL/uL — ABNORMAL LOW (ref 3.87–5.11)
RBC: 2.7 MIL/uL — ABNORMAL LOW (ref 3.87–5.11)
RBC: 2.72 MIL/uL — ABNORMAL LOW (ref 3.87–5.11)
RBC: 2.76 MIL/uL — ABNORMAL LOW (ref 3.87–5.11)
RBC: 3.07 MIL/uL — ABNORMAL LOW (ref 3.87–5.11)
RBC: 3.08 MIL/uL — ABNORMAL LOW (ref 3.87–5.11)
RBC: 3.09 MIL/uL — ABNORMAL LOW (ref 3.87–5.11)
RBC: 3.3 MIL/uL — ABNORMAL LOW (ref 3.87–5.11)
RBC: 3.42 MIL/uL — ABNORMAL LOW (ref 3.87–5.11)
RDW: 16.6 % — ABNORMAL HIGH (ref 11.5–15.5)
RDW: 17.1 % — ABNORMAL HIGH (ref 11.5–15.5)
RDW: 17.4 % — ABNORMAL HIGH (ref 11.5–15.5)
RDW: 17.4 % — ABNORMAL HIGH (ref 11.5–15.5)
RDW: 17.5 % — ABNORMAL HIGH (ref 11.5–15.5)
RDW: 17.9 % — ABNORMAL HIGH (ref 11.5–15.5)
RDW: 18.1 % — ABNORMAL HIGH (ref 11.5–15.5)
RDW: 18.3 % — ABNORMAL HIGH (ref 11.5–15.5)
RDW: 18.4 % — ABNORMAL HIGH (ref 11.5–15.5)
RDW: 18.5 % — ABNORMAL HIGH (ref 11.5–15.5)
WBC: 7.6 10*3/uL (ref 4.0–10.5)
WBC: 10.5 10*3/uL (ref 4.0–10.5)
WBC: 12.1 10*3/uL — ABNORMAL HIGH (ref 4.0–10.5)
WBC: 13.3 10*3/uL — ABNORMAL HIGH (ref 4.0–10.5)
WBC: 6 10*3/uL (ref 4.0–10.5)
WBC: 6.9 10*3/uL (ref 4.0–10.5)
WBC: 7.2 10*3/uL (ref 4.0–10.5)
WBC: 7.9 10*3/uL (ref 4.0–10.5)
WBC: 9.3 10*3/uL (ref 4.0–10.5)
WBC: 9.9 10*3/uL (ref 4.0–10.5)

## 2010-11-25 LAB — DIGOXIN LEVEL: Digoxin Level: 0.7 ng/mL — ABNORMAL LOW (ref 0.8–2.0)

## 2010-11-26 LAB — HEPARIN INDUCED THROMBOCYTOPENIA PNL
Heparin Induced Plt Ab: NEGATIVE
Patient O.D.: 0.106
UFH High Dose UFH H: 0 % Release
UFH Low Dose 0.1 IU/mL: 0 % Release
UFH Low Dose 0.5 IU/mL: 0 % Release
UFH SRA Result: NEGATIVE

## 2010-11-26 LAB — BASIC METABOLIC PANEL
BUN: 14 mg/dL (ref 6–23)
BUN: 18 mg/dL (ref 6–23)
BUN: 19 mg/dL (ref 6–23)
BUN: 28 mg/dL — ABNORMAL HIGH (ref 6–23)
BUN: 33 mg/dL — ABNORMAL HIGH (ref 6–23)
BUN: 35 mg/dL — ABNORMAL HIGH (ref 6–23)
BUN: 35 mg/dL — ABNORMAL HIGH (ref 6–23)
BUN: 38 mg/dL — ABNORMAL HIGH (ref 6–23)
BUN: 40 mg/dL — ABNORMAL HIGH (ref 6–23)
BUN: 40 mg/dL — ABNORMAL HIGH (ref 6–23)
BUN: 42 mg/dL — ABNORMAL HIGH (ref 6–23)
BUN: 43 mg/dL — ABNORMAL HIGH (ref 6–23)
BUN: 43 mg/dL — ABNORMAL HIGH (ref 6–23)
BUN: 45 mg/dL — ABNORMAL HIGH (ref 6–23)
BUN: 50 mg/dL — ABNORMAL HIGH (ref 6–23)
CO2: 25 mEq/L (ref 19–32)
CO2: 26 mEq/L (ref 19–32)
CO2: 26 mEq/L (ref 19–32)
CO2: 27 mEq/L (ref 19–32)
CO2: 27 mEq/L (ref 19–32)
CO2: 29 mEq/L (ref 19–32)
CO2: 29 mEq/L (ref 19–32)
CO2: 30 mEq/L (ref 19–32)
CO2: 30 mEq/L (ref 19–32)
CO2: 31 mEq/L (ref 19–32)
CO2: 31 mEq/L (ref 19–32)
CO2: 33 mEq/L — ABNORMAL HIGH (ref 19–32)
CO2: 36 mEq/L — ABNORMAL HIGH (ref 19–32)
CO2: 36 mEq/L — ABNORMAL HIGH (ref 19–32)
CO2: 36 mEq/L — ABNORMAL HIGH (ref 19–32)
Calcium: 7.3 mg/dL — ABNORMAL LOW (ref 8.4–10.5)
Calcium: 7.5 mg/dL — ABNORMAL LOW (ref 8.4–10.5)
Calcium: 7.8 mg/dL — ABNORMAL LOW (ref 8.4–10.5)
Calcium: 7.9 mg/dL — ABNORMAL LOW (ref 8.4–10.5)
Calcium: 7.9 mg/dL — ABNORMAL LOW (ref 8.4–10.5)
Calcium: 8 mg/dL — ABNORMAL LOW (ref 8.4–10.5)
Calcium: 8 mg/dL — ABNORMAL LOW (ref 8.4–10.5)
Calcium: 8.1 mg/dL — ABNORMAL LOW (ref 8.4–10.5)
Calcium: 8.1 mg/dL — ABNORMAL LOW (ref 8.4–10.5)
Calcium: 8.2 mg/dL — ABNORMAL LOW (ref 8.4–10.5)
Calcium: 8.3 mg/dL — ABNORMAL LOW (ref 8.4–10.5)
Calcium: 8.3 mg/dL — ABNORMAL LOW (ref 8.4–10.5)
Calcium: 8.4 mg/dL (ref 8.4–10.5)
Calcium: 8.6 mg/dL (ref 8.4–10.5)
Calcium: 8.7 mg/dL (ref 8.4–10.5)
Chloride: 101 mEq/L (ref 96–112)
Chloride: 102 mEq/L (ref 96–112)
Chloride: 103 mEq/L (ref 96–112)
Chloride: 104 mEq/L (ref 96–112)
Chloride: 105 mEq/L (ref 96–112)
Chloride: 105 mEq/L (ref 96–112)
Chloride: 106 mEq/L (ref 96–112)
Chloride: 106 mEq/L (ref 96–112)
Chloride: 106 mEq/L (ref 96–112)
Chloride: 108 mEq/L (ref 96–112)
Chloride: 109 mEq/L (ref 96–112)
Chloride: 110 mEq/L (ref 96–112)
Chloride: 95 mEq/L — ABNORMAL LOW (ref 96–112)
Chloride: 96 mEq/L (ref 96–112)
Chloride: 97 mEq/L (ref 96–112)
Creatinine, Ser: 0.83 mg/dL (ref 0.4–1.2)
Creatinine, Ser: 0.83 mg/dL (ref 0.4–1.2)
Creatinine, Ser: 0.91 mg/dL (ref 0.4–1.2)
Creatinine, Ser: 0.96 mg/dL (ref 0.4–1.2)
Creatinine, Ser: 0.98 mg/dL (ref 0.4–1.2)
Creatinine, Ser: 1 mg/dL (ref 0.4–1.2)
Creatinine, Ser: 1.02 mg/dL (ref 0.4–1.2)
Creatinine, Ser: 1.03 mg/dL (ref 0.4–1.2)
Creatinine, Ser: 1.07 mg/dL (ref 0.4–1.2)
Creatinine, Ser: 1.11 mg/dL (ref 0.4–1.2)
Creatinine, Ser: 1.23 mg/dL — ABNORMAL HIGH (ref 0.4–1.2)
Creatinine, Ser: 1.28 mg/dL — ABNORMAL HIGH (ref 0.4–1.2)
Creatinine, Ser: 1.33 mg/dL — ABNORMAL HIGH (ref 0.4–1.2)
Creatinine, Ser: 1.33 mg/dL — ABNORMAL HIGH (ref 0.4–1.2)
Creatinine, Ser: 1.38 mg/dL — ABNORMAL HIGH (ref 0.4–1.2)
GFR calc Af Amer: 45 mL/min — ABNORMAL LOW (ref >60)
GFR calc Af Amer: 47 mL/min — ABNORMAL LOW (ref >60)
GFR calc Af Amer: 47 mL/min — ABNORMAL LOW (ref >60)
GFR calc Af Amer: 49 mL/min — ABNORMAL LOW (ref >60)
GFR calc Af Amer: 51 mL/min — ABNORMAL LOW (ref >60)
GFR calc Af Amer: 57 mL/min — ABNORMAL LOW (ref >60)
GFR calc Af Amer: 60 mL/min — ABNORMAL LOW (ref >60)
GFR calc Af Amer: >60 mL/min (ref >60)
GFR calc Af Amer: >60 mL/min (ref >60)
GFR calc Af Amer: >60 mL/min (ref >60)
GFR calc Af Amer: >60 mL/min (ref >60)
GFR calc Af Amer: >60 mL/min (ref >60)
GFR calc Af Amer: >60 mL/min (ref >60)
GFR calc Af Amer: >60 mL/min (ref >60)
GFR calc Af Amer: >60 mL/min (ref >60)
GFR calc non Af Amer: 37 mL/min — ABNORMAL LOW (ref >60)
GFR calc non Af Amer: 38 mL/min — ABNORMAL LOW (ref >60)
GFR calc non Af Amer: 38 mL/min — ABNORMAL LOW (ref >60)
GFR calc non Af Amer: 40 mL/min — ABNORMAL LOW (ref >60)
GFR calc non Af Amer: 42 mL/min — ABNORMAL LOW (ref >60)
GFR calc non Af Amer: 47 mL/min — ABNORMAL LOW (ref >60)
GFR calc non Af Amer: 49 mL/min — ABNORMAL LOW (ref >60)
GFR calc non Af Amer: 52 mL/min — ABNORMAL LOW (ref >60)
GFR calc non Af Amer: 52 mL/min — ABNORMAL LOW (ref >60)
GFR calc non Af Amer: 53 mL/min — ABNORMAL LOW (ref >60)
GFR calc non Af Amer: 55 mL/min — ABNORMAL LOW (ref >60)
GFR calc non Af Amer: 56 mL/min — ABNORMAL LOW (ref >60)
GFR calc non Af Amer: 60 mL/min — ABNORMAL LOW (ref >60)
GFR calc non Af Amer: >60 mL/min (ref >60)
GFR calc non Af Amer: >60 mL/min (ref >60)
Glucose, Bld: 102 mg/dL — ABNORMAL HIGH (ref 70–99)
Glucose, Bld: 106 mg/dL — ABNORMAL HIGH (ref 70–99)
Glucose, Bld: 112 mg/dL — ABNORMAL HIGH (ref 70–99)
Glucose, Bld: 117 mg/dL — ABNORMAL HIGH (ref 70–99)
Glucose, Bld: 131 mg/dL — ABNORMAL HIGH (ref 70–99)
Glucose, Bld: 132 mg/dL — ABNORMAL HIGH (ref 70–99)
Glucose, Bld: 140 mg/dL — ABNORMAL HIGH (ref 70–99)
Glucose, Bld: 140 mg/dL — ABNORMAL HIGH (ref 70–99)
Glucose, Bld: 158 mg/dL — ABNORMAL HIGH (ref 70–99)
Glucose, Bld: 161 mg/dL — ABNORMAL HIGH (ref 70–99)
Glucose, Bld: 167 mg/dL — ABNORMAL HIGH (ref 70–99)
Glucose, Bld: 170 mg/dL — ABNORMAL HIGH (ref 70–99)
Glucose, Bld: 171 mg/dL — ABNORMAL HIGH (ref 70–99)
Glucose, Bld: 187 mg/dL — ABNORMAL HIGH (ref 70–99)
Glucose, Bld: 192 mg/dL — ABNORMAL HIGH (ref 70–99)
Potassium: 3.2 mEq/L — ABNORMAL LOW (ref 3.5–5.1)
Potassium: 3.4 mEq/L — ABNORMAL LOW (ref 3.5–5.1)
Potassium: 3.5 mEq/L (ref 3.5–5.1)
Potassium: 3.5 mEq/L (ref 3.5–5.1)
Potassium: 3.6 mEq/L (ref 3.5–5.1)
Potassium: 3.8 mEq/L (ref 3.5–5.1)
Potassium: 3.9 mEq/L (ref 3.5–5.1)
Potassium: 3.9 mEq/L (ref 3.5–5.1)
Potassium: 4 mEq/L (ref 3.5–5.1)
Potassium: 4.1 mEq/L (ref 3.5–5.1)
Potassium: 4.2 mEq/L (ref 3.5–5.1)
Potassium: 4.4 mEq/L (ref 3.5–5.1)
Potassium: 4.4 mEq/L (ref 3.5–5.1)
Potassium: 4.7 mEq/L (ref 3.5–5.1)
Potassium: 4.9 mEq/L (ref 3.5–5.1)
Sodium: 137 mEq/L (ref 135–145)
Sodium: 137 mEq/L (ref 135–145)
Sodium: 138 mEq/L (ref 135–145)
Sodium: 138 mEq/L (ref 135–145)
Sodium: 139 mEq/L (ref 135–145)
Sodium: 140 mEq/L (ref 135–145)
Sodium: 140 mEq/L (ref 135–145)
Sodium: 141 mEq/L (ref 135–145)
Sodium: 141 mEq/L (ref 135–145)
Sodium: 141 mEq/L (ref 135–145)
Sodium: 141 mEq/L (ref 135–145)
Sodium: 141 mEq/L (ref 135–145)
Sodium: 141 mEq/L (ref 135–145)
Sodium: 143 mEq/L (ref 135–145)
Sodium: 145 mEq/L (ref 135–145)

## 2010-11-26 LAB — POCT I-STAT 3, ART BLOOD GAS (G3+)
Acid-Base Excess: 2 mmol/L (ref 0.0–2.0)
Acid-Base Excess: 3 mmol/L — ABNORMAL HIGH (ref 0.0–2.0)
Acid-Base Excess: 5 mmol/L — ABNORMAL HIGH (ref 0.0–2.0)
Acid-Base Excess: 5 mmol/L — ABNORMAL HIGH (ref 0.0–2.0)
Acid-Base Excess: 5 mmol/L — ABNORMAL HIGH (ref 0.0–2.0)
Acid-base deficit: 1 mmol/L (ref 0.0–2.0)
Acid-base deficit: 1 mmol/L (ref 0.0–2.0)
Acid-base deficit: 1 mmol/L (ref 0.0–2.0)
Bicarbonate: 24.1 mEq/L — ABNORMAL HIGH (ref 20.0–24.0)
Bicarbonate: 24.3 mEq/L — ABNORMAL HIGH (ref 20.0–24.0)
Bicarbonate: 25.2 mEq/L — ABNORMAL HIGH (ref 20.0–24.0)
Bicarbonate: 25.5 mEq/L — ABNORMAL HIGH (ref 20.0–24.0)
Bicarbonate: 25.5 mEq/L — ABNORMAL HIGH (ref 20.0–24.0)
Bicarbonate: 27.3 mEq/L — ABNORMAL HIGH (ref 20.0–24.0)
Bicarbonate: 28.7 mEq/L — ABNORMAL HIGH (ref 20.0–24.0)
Bicarbonate: 28.9 mEq/L — ABNORMAL HIGH (ref 20.0–24.0)
Bicarbonate: 31.9 mEq/L — ABNORMAL HIGH (ref 20.0–24.0)
O2 Saturation: 100 %
O2 Saturation: 100 %
O2 Saturation: 100 %
O2 Saturation: 100 %
O2 Saturation: 35 %
O2 Saturation: 93 %
O2 Saturation: 97 %
O2 Saturation: 97 %
O2 Saturation: 99 %
Patient temperature: 34.7
Patient temperature: 37
Patient temperature: 37
Patient temperature: 37
Patient temperature: 97.4
Patient temperature: 97.7
Patient temperature: 99.3
TCO2: 25 mmol/L (ref 0–100)
TCO2: 26 mmol/L (ref 0–100)
TCO2: 27 mmol/L (ref 0–100)
TCO2: 27 mmol/L (ref 0–100)
TCO2: 27 mmol/L (ref 0–100)
TCO2: 28 mmol/L (ref 0–100)
TCO2: 30 mmol/L (ref 0–100)
TCO2: 30 mmol/L (ref 0–100)
TCO2: 34 mmol/L (ref 0–100)
pCO2 arterial: 35.9 mmHg (ref 35.0–45.0)
pCO2 arterial: 37.5 mmHg (ref 35.0–45.0)
pCO2 arterial: 39.1 mmHg (ref 35.0–45.0)
pCO2 arterial: 39.4 mmHg (ref 35.0–45.0)
pCO2 arterial: 40.9 mmHg (ref 35.0–45.0)
pCO2 arterial: 42.7 mmHg (ref 35.0–45.0)
pCO2 arterial: 43 mmHg (ref 35.0–45.0)
pCO2 arterial: 47.6 mmHg — ABNORMAL HIGH (ref 35.0–45.0)
pCO2 arterial: 60.3 mmHg (ref 35.0–45.0)
pH, Arterial: 7.328 — ABNORMAL LOW (ref 7.350–7.400)
pH, Arterial: 7.331 — ABNORMAL LOW (ref 7.350–7.400)
pH, Arterial: 7.363 (ref 7.350–7.400)
pH, Arterial: 7.382 (ref 7.350–7.400)
pH, Arterial: 7.384 (ref 7.350–7.400)
pH, Arterial: 7.457 — ABNORMAL HIGH (ref 7.350–7.400)
pH, Arterial: 7.46 — ABNORMAL HIGH (ref 7.350–7.400)
pH, Arterial: 7.468 — ABNORMAL HIGH (ref 7.350–7.400)
pH, Arterial: 7.475 — ABNORMAL HIGH (ref 7.350–7.400)
pO2, Arterial: 115 mmHg — ABNORMAL HIGH (ref 80.0–100.0)
pO2, Arterial: 192 mmHg — ABNORMAL HIGH (ref 80.0–100.0)
pO2, Arterial: 22 mmHg — CL (ref 80.0–100.0)
pO2, Arterial: 443 mmHg — ABNORMAL HIGH (ref 80.0–100.0)
pO2, Arterial: 484 mmHg — ABNORMAL HIGH (ref 80.0–100.0)
pO2, Arterial: 504 mmHg — ABNORMAL HIGH (ref 80.0–100.0)
pO2, Arterial: 71 mmHg — ABNORMAL LOW (ref 80.0–100.0)
pO2, Arterial: 82 mmHg (ref 80.0–100.0)
pO2, Arterial: 97 mmHg (ref 80.0–100.0)

## 2010-11-26 LAB — CBC
HCT: 21.5 % — ABNORMAL LOW (ref 36.0–46.0)
HCT: 22.1 % — ABNORMAL LOW (ref 36.0–46.0)
HCT: 23.1 % — ABNORMAL LOW (ref 36.0–46.0)
HCT: 24 % — ABNORMAL LOW (ref 36.0–46.0)
HCT: 24.1 % — ABNORMAL LOW (ref 36.0–46.0)
HCT: 24.3 % — ABNORMAL LOW (ref 36.0–46.0)
HCT: 24.3 % — ABNORMAL LOW (ref 36.0–46.0)
HCT: 25.8 % — ABNORMAL LOW (ref 36.0–46.0)
HCT: 26.2 % — ABNORMAL LOW (ref 36.0–46.0)
HCT: 26.8 % — ABNORMAL LOW (ref 36.0–46.0)
HCT: 29.7 % — ABNORMAL LOW (ref 36.0–46.0)
HCT: 30.1 % — ABNORMAL LOW (ref 36.0–46.0)
HCT: 30.1 % — ABNORMAL LOW (ref 36.0–46.0)
HCT: 30.6 % — ABNORMAL LOW (ref 36.0–46.0)
HCT: 31.6 % — ABNORMAL LOW (ref 36.0–46.0)
HCT: 32.6 % — ABNORMAL LOW (ref 36.0–46.0)
HCT: 33.8 % — ABNORMAL LOW (ref 36.0–46.0)
HCT: 35.4 % — ABNORMAL LOW (ref 36.0–46.0)
Hemoglobin: 10.1 g/dL — ABNORMAL LOW (ref 12.0–15.0)
Hemoglobin: 10.2 g/dL — ABNORMAL LOW (ref 12.0–15.0)
Hemoglobin: 10.5 g/dL — ABNORMAL LOW (ref 12.0–15.0)
Hemoglobin: 10.7 g/dL — ABNORMAL LOW (ref 12.0–15.0)
Hemoglobin: 11 g/dL — ABNORMAL LOW (ref 12.0–15.0)
Hemoglobin: 11.6 g/dL — ABNORMAL LOW (ref 12.0–15.0)
Hemoglobin: 7.1 g/dL — ABNORMAL LOW (ref 12.0–15.0)
Hemoglobin: 7.4 g/dL — ABNORMAL LOW (ref 12.0–15.0)
Hemoglobin: 7.6 g/dL — ABNORMAL LOW (ref 12.0–15.0)
Hemoglobin: 7.9 g/dL — ABNORMAL LOW (ref 12.0–15.0)
Hemoglobin: 8 g/dL — ABNORMAL LOW (ref 12.0–15.0)
Hemoglobin: 8 g/dL — ABNORMAL LOW (ref 12.0–15.0)
Hemoglobin: 8.1 g/dL — ABNORMAL LOW (ref 12.0–15.0)
Hemoglobin: 8.5 g/dL — ABNORMAL LOW (ref 12.0–15.0)
Hemoglobin: 8.7 g/dL — ABNORMAL LOW (ref 12.0–15.0)
Hemoglobin: 8.9 g/dL — ABNORMAL LOW (ref 12.0–15.0)
Hemoglobin: 9.8 g/dL — ABNORMAL LOW (ref 12.0–15.0)
Hemoglobin: 9.8 g/dL — ABNORMAL LOW (ref 12.0–15.0)
MCH: 28.6 pg (ref 26.0–34.0)
MCH: 28.7 pg (ref 26.0–34.0)
MCH: 28.8 pg (ref 26.0–34.0)
MCH: 28.8 pg (ref 26.0–34.0)
MCH: 28.8 pg (ref 26.0–34.0)
MCH: 28.9 pg (ref 26.0–34.0)
MCH: 29 pg (ref 26.0–34.0)
MCH: 29 pg (ref 26.0–34.0)
MCH: 29.1 pg (ref 26.0–34.0)
MCH: 29.3 pg (ref 26.0–34.0)
MCH: 29.5 pg (ref 26.0–34.0)
MCH: 29.6 pg (ref 26.0–34.0)
MCH: 29.7 pg (ref 26.0–34.0)
MCH: 29.7 pg (ref 26.0–34.0)
MCH: 29.8 pg (ref 26.0–34.0)
MCH: 29.8 pg (ref 26.0–34.0)
MCH: 29.8 pg (ref 26.0–34.0)
MCH: 30.3 pg (ref 26.0–34.0)
MCHC: 32.4 g/dL (ref 30.0–36.0)
MCHC: 32.6 g/dL (ref 30.0–36.0)
MCHC: 32.7 g/dL (ref 30.0–36.0)
MCHC: 32.8 g/dL (ref 30.0–36.0)
MCHC: 32.8 g/dL (ref 30.0–36.0)
MCHC: 32.8 g/dL (ref 30.0–36.0)
MCHC: 32.8 g/dL (ref 30.0–36.0)
MCHC: 32.9 g/dL (ref 30.0–36.0)
MCHC: 32.9 g/dL (ref 30.0–36.0)
MCHC: 33 g/dL (ref 30.0–36.0)
MCHC: 33 g/dL (ref 30.0–36.0)
MCHC: 33.1 g/dL (ref 30.0–36.0)
MCHC: 33.1 g/dL (ref 30.0–36.0)
MCHC: 33.3 g/dL (ref 30.0–36.0)
MCHC: 33.3 g/dL (ref 30.0–36.0)
MCHC: 33.3 g/dL (ref 30.0–36.0)
MCHC: 33.5 g/dL (ref 30.0–36.0)
MCHC: 33.8 g/dL (ref 30.0–36.0)
MCV: 85.7 fL (ref 78.0–100.0)
MCV: 85.9 fL (ref 78.0–100.0)
MCV: 87.3 fL (ref 78.0–100.0)
MCV: 87.7 fL (ref 78.0–100.0)
MCV: 87.8 fL (ref 78.0–100.0)
MCV: 87.9 fL (ref 78.0–100.0)
MCV: 88.3 fL (ref 78.0–100.0)
MCV: 88.3 fL (ref 78.0–100.0)
MCV: 88.9 fL (ref 78.0–100.0)
MCV: 89 fL (ref 78.0–100.0)
MCV: 89 fL (ref 78.0–100.0)
MCV: 89.2 fL (ref 78.0–100.0)
MCV: 89.3 fL (ref 78.0–100.0)
MCV: 90.2 fL (ref 78.0–100.0)
MCV: 90.5 fL (ref 78.0–100.0)
MCV: 90.6 fL (ref 78.0–100.0)
MCV: 90.6 fL (ref 78.0–100.0)
MCV: 91 fL (ref 78.0–100.0)
Platelets: 105 10*3/uL — ABNORMAL LOW (ref 150–400)
Platelets: 120 10*3/uL — ABNORMAL LOW (ref 150–400)
Platelets: 168 10*3/uL (ref 150–400)
Platelets: 188 10*3/uL (ref 150–400)
Platelets: 210 10*3/uL (ref 150–400)
Platelets: 47 10*3/uL — ABNORMAL LOW (ref 150–400)
Platelets: 48 10*3/uL — ABNORMAL LOW (ref 150–400)
Platelets: 49 10*3/uL — ABNORMAL LOW (ref 150–400)
Platelets: 54 10*3/uL — ABNORMAL LOW (ref 150–400)
Platelets: 62 10*3/uL — ABNORMAL LOW (ref 150–400)
Platelets: 63 10*3/uL — ABNORMAL LOW (ref 150–400)
Platelets: 69 10*3/uL — ABNORMAL LOW (ref 150–400)
Platelets: 70 10*3/uL — ABNORMAL LOW (ref 150–400)
Platelets: 72 10*3/uL — ABNORMAL LOW (ref 150–400)
Platelets: 74 10*3/uL — ABNORMAL LOW (ref 150–400)
Platelets: 75 10*3/uL — ABNORMAL LOW (ref 150–400)
Platelets: 78 10*3/uL — ABNORMAL LOW (ref 150–400)
Platelets: 81 10*3/uL — ABNORMAL LOW (ref 150–400)
RBC: 2.44 MIL/uL — ABNORMAL LOW (ref 3.87–5.11)
RBC: 2.48 MIL/uL — ABNORMAL LOW (ref 3.87–5.11)
RBC: 2.55 MIL/uL — ABNORMAL LOW (ref 3.87–5.11)
RBC: 2.65 MIL/uL — ABNORMAL LOW (ref 3.87–5.11)
RBC: 2.69 MIL/uL — ABNORMAL LOW (ref 3.87–5.11)
RBC: 2.71 MIL/uL — ABNORMAL LOW (ref 3.87–5.11)
RBC: 2.72 MIL/uL — ABNORMAL LOW (ref 3.87–5.11)
RBC: 2.85 MIL/uL — ABNORMAL LOW (ref 3.87–5.11)
RBC: 2.88 MIL/uL — ABNORMAL LOW (ref 3.87–5.11)
RBC: 3.13 MIL/uL — ABNORMAL LOW (ref 3.87–5.11)
RBC: 3.39 MIL/uL — ABNORMAL LOW (ref 3.87–5.11)
RBC: 3.4 MIL/uL — ABNORMAL LOW (ref 3.87–5.11)
RBC: 3.44 MIL/uL — ABNORMAL LOW (ref 3.87–5.11)
RBC: 3.52 MIL/uL — ABNORMAL LOW (ref 3.87–5.11)
RBC: 3.61 MIL/uL — ABNORMAL LOW (ref 3.87–5.11)
RBC: 3.71 MIL/uL — ABNORMAL LOW (ref 3.87–5.11)
RBC: 3.84 MIL/uL — ABNORMAL LOW (ref 3.87–5.11)
RBC: 4.01 MIL/uL (ref 3.87–5.11)
RDW: 14.5 % (ref 11.5–15.5)
RDW: 14.6 % (ref 11.5–15.5)
RDW: 14.7 % (ref 11.5–15.5)
RDW: 15.4 % (ref 11.5–15.5)
RDW: 15.4 % (ref 11.5–15.5)
RDW: 15.5 % (ref 11.5–15.5)
RDW: 15.8 % — ABNORMAL HIGH (ref 11.5–15.5)
RDW: 16.1 % — ABNORMAL HIGH (ref 11.5–15.5)
RDW: 16.1 % — ABNORMAL HIGH (ref 11.5–15.5)
RDW: 16.1 % — ABNORMAL HIGH (ref 11.5–15.5)
RDW: 16.2 % — ABNORMAL HIGH (ref 11.5–15.5)
RDW: 16.8 % — ABNORMAL HIGH (ref 11.5–15.5)
RDW: 17.6 % — ABNORMAL HIGH (ref 11.5–15.5)
RDW: 17.8 % — ABNORMAL HIGH (ref 11.5–15.5)
RDW: 18.2 % — ABNORMAL HIGH (ref 11.5–15.5)
RDW: 18.2 % — ABNORMAL HIGH (ref 11.5–15.5)
RDW: 18.2 % — ABNORMAL HIGH (ref 11.5–15.5)
RDW: 18.2 % — ABNORMAL HIGH (ref 11.5–15.5)
WBC: 10.4 10*3/uL (ref 4.0–10.5)
WBC: 10.7 10*3/uL — ABNORMAL HIGH (ref 4.0–10.5)
WBC: 11.7 10*3/uL — ABNORMAL HIGH (ref 4.0–10.5)
WBC: 12 10*3/uL — ABNORMAL HIGH (ref 4.0–10.5)
WBC: 12.1 10*3/uL — ABNORMAL HIGH (ref 4.0–10.5)
WBC: 12.6 10*3/uL — ABNORMAL HIGH (ref 4.0–10.5)
WBC: 12.7 10*3/uL — ABNORMAL HIGH (ref 4.0–10.5)
WBC: 12.9 10*3/uL — ABNORMAL HIGH (ref 4.0–10.5)
WBC: 13.1 10*3/uL — ABNORMAL HIGH (ref 4.0–10.5)
WBC: 14 10*3/uL — ABNORMAL HIGH (ref 4.0–10.5)
WBC: 14.1 10*3/uL — ABNORMAL HIGH (ref 4.0–10.5)
WBC: 14.4 10*3/uL — ABNORMAL HIGH (ref 4.0–10.5)
WBC: 17 10*3/uL — ABNORMAL HIGH (ref 4.0–10.5)
WBC: 22.4 10*3/uL — ABNORMAL HIGH (ref 4.0–10.5)
WBC: 8.5 10*3/uL (ref 4.0–10.5)
WBC: 8.5 10*3/uL (ref 4.0–10.5)
WBC: 8.7 10*3/uL (ref 4.0–10.5)
WBC: 8.7 10*3/uL (ref 4.0–10.5)

## 2010-11-26 LAB — URINALYSIS, ROUTINE W REFLEX MICROSCOPIC
Bilirubin Urine: NEGATIVE
Glucose, UA: NEGATIVE mg/dL
Hgb urine dipstick: NEGATIVE
Ketones, ur: NEGATIVE mg/dL
Nitrite: NEGATIVE
Protein, ur: 100 mg/dL — AB
Specific Gravity, Urine: 1.023 (ref 1.005–1.030)
Urobilinogen, UA: 0.2 mg/dL (ref 0.0–1.0)
pH: 7.5 (ref 5.0–8.0)

## 2010-11-26 LAB — GLUCOSE, CAPILLARY
Glucose-Capillary: 101 mg/dL — ABNORMAL HIGH (ref 70–99)
Glucose-Capillary: 102 mg/dL — ABNORMAL HIGH (ref 70–99)
Glucose-Capillary: 103 mg/dL — ABNORMAL HIGH (ref 70–99)
Glucose-Capillary: 104 mg/dL — ABNORMAL HIGH (ref 70–99)
Glucose-Capillary: 105 mg/dL — ABNORMAL HIGH (ref 70–99)
Glucose-Capillary: 108 mg/dL — ABNORMAL HIGH (ref 70–99)
Glucose-Capillary: 108 mg/dL — ABNORMAL HIGH (ref 70–99)
Glucose-Capillary: 108 mg/dL — ABNORMAL HIGH (ref 70–99)
Glucose-Capillary: 109 mg/dL — ABNORMAL HIGH (ref 70–99)
Glucose-Capillary: 109 mg/dL — ABNORMAL HIGH (ref 70–99)
Glucose-Capillary: 109 mg/dL — ABNORMAL HIGH (ref 70–99)
Glucose-Capillary: 110 mg/dL — ABNORMAL HIGH (ref 70–99)
Glucose-Capillary: 111 mg/dL — ABNORMAL HIGH (ref 70–99)
Glucose-Capillary: 112 mg/dL — ABNORMAL HIGH (ref 70–99)
Glucose-Capillary: 112 mg/dL — ABNORMAL HIGH (ref 70–99)
Glucose-Capillary: 113 mg/dL — ABNORMAL HIGH (ref 70–99)
Glucose-Capillary: 114 mg/dL — ABNORMAL HIGH (ref 70–99)
Glucose-Capillary: 114 mg/dL — ABNORMAL HIGH (ref 70–99)
Glucose-Capillary: 114 mg/dL — ABNORMAL HIGH (ref 70–99)
Glucose-Capillary: 115 mg/dL — ABNORMAL HIGH (ref 70–99)
Glucose-Capillary: 115 mg/dL — ABNORMAL HIGH (ref 70–99)
Glucose-Capillary: 116 mg/dL — ABNORMAL HIGH (ref 70–99)
Glucose-Capillary: 117 mg/dL — ABNORMAL HIGH (ref 70–99)
Glucose-Capillary: 118 mg/dL — ABNORMAL HIGH (ref 70–99)
Glucose-Capillary: 122 mg/dL — ABNORMAL HIGH (ref 70–99)
Glucose-Capillary: 123 mg/dL — ABNORMAL HIGH (ref 70–99)
Glucose-Capillary: 124 mg/dL — ABNORMAL HIGH (ref 70–99)
Glucose-Capillary: 124 mg/dL — ABNORMAL HIGH (ref 70–99)
Glucose-Capillary: 124 mg/dL — ABNORMAL HIGH (ref 70–99)
Glucose-Capillary: 126 mg/dL — ABNORMAL HIGH (ref 70–99)
Glucose-Capillary: 126 mg/dL — ABNORMAL HIGH (ref 70–99)
Glucose-Capillary: 126 mg/dL — ABNORMAL HIGH (ref 70–99)
Glucose-Capillary: 127 mg/dL — ABNORMAL HIGH (ref 70–99)
Glucose-Capillary: 128 mg/dL — ABNORMAL HIGH (ref 70–99)
Glucose-Capillary: 130 mg/dL — ABNORMAL HIGH (ref 70–99)
Glucose-Capillary: 131 mg/dL — ABNORMAL HIGH (ref 70–99)
Glucose-Capillary: 132 mg/dL — ABNORMAL HIGH (ref 70–99)
Glucose-Capillary: 132 mg/dL — ABNORMAL HIGH (ref 70–99)
Glucose-Capillary: 133 mg/dL — ABNORMAL HIGH (ref 70–99)
Glucose-Capillary: 133 mg/dL — ABNORMAL HIGH (ref 70–99)
Glucose-Capillary: 134 mg/dL — ABNORMAL HIGH (ref 70–99)
Glucose-Capillary: 135 mg/dL — ABNORMAL HIGH (ref 70–99)
Glucose-Capillary: 136 mg/dL — ABNORMAL HIGH (ref 70–99)
Glucose-Capillary: 136 mg/dL — ABNORMAL HIGH (ref 70–99)
Glucose-Capillary: 136 mg/dL — ABNORMAL HIGH (ref 70–99)
Glucose-Capillary: 137 mg/dL — ABNORMAL HIGH (ref 70–99)
Glucose-Capillary: 137 mg/dL — ABNORMAL HIGH (ref 70–99)
Glucose-Capillary: 138 mg/dL — ABNORMAL HIGH (ref 70–99)
Glucose-Capillary: 138 mg/dL — ABNORMAL HIGH (ref 70–99)
Glucose-Capillary: 138 mg/dL — ABNORMAL HIGH (ref 70–99)
Glucose-Capillary: 138 mg/dL — ABNORMAL HIGH (ref 70–99)
Glucose-Capillary: 139 mg/dL — ABNORMAL HIGH (ref 70–99)
Glucose-Capillary: 140 mg/dL — ABNORMAL HIGH (ref 70–99)
Glucose-Capillary: 140 mg/dL — ABNORMAL HIGH (ref 70–99)
Glucose-Capillary: 145 mg/dL — ABNORMAL HIGH (ref 70–99)
Glucose-Capillary: 145 mg/dL — ABNORMAL HIGH (ref 70–99)
Glucose-Capillary: 145 mg/dL — ABNORMAL HIGH (ref 70–99)
Glucose-Capillary: 145 mg/dL — ABNORMAL HIGH (ref 70–99)
Glucose-Capillary: 147 mg/dL — ABNORMAL HIGH (ref 70–99)
Glucose-Capillary: 147 mg/dL — ABNORMAL HIGH (ref 70–99)
Glucose-Capillary: 147 mg/dL — ABNORMAL HIGH (ref 70–99)
Glucose-Capillary: 149 mg/dL — ABNORMAL HIGH (ref 70–99)
Glucose-Capillary: 150 mg/dL — ABNORMAL HIGH (ref 70–99)
Glucose-Capillary: 154 mg/dL — ABNORMAL HIGH (ref 70–99)
Glucose-Capillary: 157 mg/dL — ABNORMAL HIGH (ref 70–99)
Glucose-Capillary: 161 mg/dL — ABNORMAL HIGH (ref 70–99)
Glucose-Capillary: 162 mg/dL — ABNORMAL HIGH (ref 70–99)
Glucose-Capillary: 163 mg/dL — ABNORMAL HIGH (ref 70–99)
Glucose-Capillary: 163 mg/dL — ABNORMAL HIGH (ref 70–99)
Glucose-Capillary: 164 mg/dL — ABNORMAL HIGH (ref 70–99)
Glucose-Capillary: 165 mg/dL — ABNORMAL HIGH (ref 70–99)
Glucose-Capillary: 167 mg/dL — ABNORMAL HIGH (ref 70–99)
Glucose-Capillary: 20 mg/dL — CL (ref 70–99)
Glucose-Capillary: 234 mg/dL — ABNORMAL HIGH (ref 70–99)
Glucose-Capillary: 24 mg/dL — CL (ref 70–99)
Glucose-Capillary: 36 mg/dL — CL (ref 70–99)
Glucose-Capillary: 395 mg/dL — ABNORMAL HIGH (ref 70–99)
Glucose-Capillary: 66 mg/dL — ABNORMAL LOW (ref 70–99)
Glucose-Capillary: 89 mg/dL (ref 70–99)
Glucose-Capillary: 92 mg/dL (ref 70–99)
Glucose-Capillary: 93 mg/dL (ref 70–99)
Glucose-Capillary: 95 mg/dL (ref 70–99)
Glucose-Capillary: 95 mg/dL (ref 70–99)
Glucose-Capillary: 99 mg/dL (ref 70–99)

## 2010-11-26 LAB — POCT I-STAT, CHEM 8
BUN: 15 mg/dL (ref 6–23)
BUN: 20 mg/dL (ref 6–23)
BUN: 51 mg/dL — ABNORMAL HIGH (ref 6–23)
Calcium, Ion: 1.06 mmol/L — ABNORMAL LOW (ref 1.12–1.32)
Calcium, Ion: 1.16 mmol/L (ref 1.12–1.32)
Calcium, Ion: 1.2 mmol/L (ref 1.12–1.32)
Chloride: 104 mEq/L (ref 96–112)
Chloride: 106 mEq/L (ref 96–112)
Chloride: 106 mEq/L (ref 96–112)
Creatinine, Ser: 1 mg/dL (ref 0.4–1.2)
Creatinine, Ser: 1.1 mg/dL (ref 0.4–1.2)
Creatinine, Ser: 1.4 mg/dL — ABNORMAL HIGH (ref 0.4–1.2)
Glucose, Bld: 102 mg/dL — ABNORMAL HIGH (ref 70–99)
Glucose, Bld: 111 mg/dL — ABNORMAL HIGH (ref 70–99)
Glucose, Bld: 116 mg/dL — ABNORMAL HIGH (ref 70–99)
HCT: 29 % — ABNORMAL LOW (ref 36.0–46.0)
HCT: 32 % — ABNORMAL LOW (ref 36.0–46.0)
HCT: 34 % — ABNORMAL LOW (ref 36.0–46.0)
Hemoglobin: 10.9 g/dL — ABNORMAL LOW (ref 12.0–15.0)
Hemoglobin: 11.6 g/dL — ABNORMAL LOW (ref 12.0–15.0)
Hemoglobin: 9.9 g/dL — ABNORMAL LOW (ref 12.0–15.0)
Potassium: 3.9 mEq/L (ref 3.5–5.1)
Potassium: 4.1 mEq/L (ref 3.5–5.1)
Potassium: 4.7 mEq/L (ref 3.5–5.1)
Sodium: 140 mEq/L (ref 135–145)
Sodium: 141 mEq/L (ref 135–145)
Sodium: 144 mEq/L (ref 135–145)
TCO2: 25 mmol/L (ref 0–100)
TCO2: 25 mmol/L (ref 0–100)
TCO2: 32 mmol/L (ref 0–100)

## 2010-11-26 LAB — LACTATE DEHYDROGENASE, PLEURAL OR PERITONEAL FLUID: LD, Fluid: 642 U/L — ABNORMAL HIGH (ref 3–23)

## 2010-11-26 LAB — CREATININE, SERUM
Creatinine, Ser: 0.91 mg/dL (ref 0.4–1.2)
GFR calc Af Amer: >60 mL/min (ref >60)
GFR calc non Af Amer: 60 mL/min — ABNORMAL LOW (ref >60)

## 2010-11-26 LAB — CROSSMATCH
ABO/RH(D): A POS
ABO/RH(D): A POS
Antibody Screen: NEGATIVE
Antibody Screen: NEGATIVE

## 2010-11-26 LAB — URINE MICROSCOPIC-ADD ON

## 2010-11-26 LAB — MAGNESIUM
Magnesium: 1.7 mg/dL (ref 1.5–2.5)
Magnesium: 1.9 mg/dL (ref 1.5–2.5)
Magnesium: 2.4 mg/dL (ref 1.5–2.5)
Magnesium: 3 mg/dL — ABNORMAL HIGH (ref 1.5–2.5)
Magnesium: 3.2 mg/dL — ABNORMAL HIGH (ref 1.5–2.5)

## 2010-11-26 LAB — POCT I-STAT 4, (NA,K, GLUC, HGB,HCT)
Glucose, Bld: 116 mg/dL — ABNORMAL HIGH (ref 70–99)
Glucose, Bld: 142 mg/dL — ABNORMAL HIGH (ref 70–99)
Glucose, Bld: 152 mg/dL — ABNORMAL HIGH (ref 70–99)
Glucose, Bld: 157 mg/dL — ABNORMAL HIGH (ref 70–99)
Glucose, Bld: 175 mg/dL — ABNORMAL HIGH (ref 70–99)
Glucose, Bld: 70 mg/dL (ref 70–99)
HCT: 20 % — ABNORMAL LOW (ref 36.0–46.0)
HCT: 24 % — ABNORMAL LOW (ref 36.0–46.0)
HCT: 24 % — ABNORMAL LOW (ref 36.0–46.0)
HCT: 25 % — ABNORMAL LOW (ref 36.0–46.0)
HCT: 26 % — ABNORMAL LOW (ref 36.0–46.0)
HCT: 27 % — ABNORMAL LOW (ref 36.0–46.0)
Hemoglobin: 6.8 g/dL — CL (ref 12.0–15.0)
Hemoglobin: 8.2 g/dL — ABNORMAL LOW (ref 12.0–15.0)
Hemoglobin: 8.2 g/dL — ABNORMAL LOW (ref 12.0–15.0)
Hemoglobin: 8.5 g/dL — ABNORMAL LOW (ref 12.0–15.0)
Hemoglobin: 8.8 g/dL — ABNORMAL LOW (ref 12.0–15.0)
Hemoglobin: 9.2 g/dL — ABNORMAL LOW (ref 12.0–15.0)
Potassium: 2.6 mEq/L — CL (ref 3.5–5.1)
Potassium: 3 mEq/L — ABNORMAL LOW (ref 3.5–5.1)
Potassium: 3 mEq/L — ABNORMAL LOW (ref 3.5–5.1)
Potassium: 3.2 mEq/L — ABNORMAL LOW (ref 3.5–5.1)
Potassium: 3.2 mEq/L — ABNORMAL LOW (ref 3.5–5.1)
Potassium: 4.2 mEq/L (ref 3.5–5.1)
Sodium: 133 mEq/L — ABNORMAL LOW (ref 135–145)
Sodium: 137 mEq/L (ref 135–145)
Sodium: 139 mEq/L (ref 135–145)
Sodium: 140 mEq/L (ref 135–145)
Sodium: 142 mEq/L (ref 135–145)
Sodium: 145 mEq/L (ref 135–145)

## 2010-11-26 LAB — PROTIME-INR
INR: 1 (ref 0.00–1.49)
INR: 1.36 (ref 0.00–1.49)
INR: 1.41 (ref 0.00–1.49)
INR: 1.43 (ref 0.00–1.49)
INR: 1.43 (ref 0.00–1.49)
INR: 1.45 (ref 0.00–1.49)
INR: 1.56 — ABNORMAL HIGH (ref 0.00–1.49)
INR: 1.6 — ABNORMAL HIGH (ref 0.00–1.49)
INR: 1.64 — ABNORMAL HIGH (ref 0.00–1.49)
INR: 1.67 — ABNORMAL HIGH (ref 0.00–1.49)
INR: 1.72 — ABNORMAL HIGH (ref 0.00–1.49)
INR: 1.84 — ABNORMAL HIGH (ref 0.00–1.49)
INR: 2.25 — ABNORMAL HIGH (ref 0.00–1.49)
Prothrombin Time: 13.1 seconds (ref 11.6–15.2)
Prothrombin Time: 16.7 seconds — ABNORMAL HIGH (ref 11.6–15.2)
Prothrombin Time: 17.1 seconds — ABNORMAL HIGH (ref 11.6–15.2)
Prothrombin Time: 17.3 seconds — ABNORMAL HIGH (ref 11.6–15.2)
Prothrombin Time: 17.3 seconds — ABNORMAL HIGH (ref 11.6–15.2)
Prothrombin Time: 17.5 seconds — ABNORMAL HIGH (ref 11.6–15.2)
Prothrombin Time: 18.5 seconds — ABNORMAL HIGH (ref 11.6–15.2)
Prothrombin Time: 18.9 seconds — ABNORMAL HIGH (ref 11.6–15.2)
Prothrombin Time: 19.3 seconds — ABNORMAL HIGH (ref 11.6–15.2)
Prothrombin Time: 19.6 seconds — ABNORMAL HIGH (ref 11.6–15.2)
Prothrombin Time: 20 seconds — ABNORMAL HIGH (ref 11.6–15.2)
Prothrombin Time: 21.1 seconds — ABNORMAL HIGH (ref 11.6–15.2)
Prothrombin Time: 24.7 seconds — ABNORMAL HIGH (ref 11.6–15.2)

## 2010-11-26 LAB — PH, BODY FLUID: pH, Fluid: 8

## 2010-11-26 LAB — APTT
aPTT: 34 seconds (ref 24–37)
aPTT: 35 seconds (ref 24–37)
aPTT: 58 seconds — ABNORMAL HIGH (ref 24–37)

## 2010-11-26 LAB — URINE CULTURE: Colony Count: >=100000

## 2010-11-26 LAB — BODY FLUID CELL COUNT WITH DIFFERENTIAL
Eos, Fluid: 0 %
Lymphs, Fluid: 16 %
Neutrophil Count, Fluid: 52 % — ABNORMAL HIGH (ref 0–25)
Total Nucleated Cell Count, Fluid: 1615 cu mm — ABNORMAL HIGH (ref 0–1000)

## 2010-11-26 LAB — FUNGUS CULTURE W SMEAR: Fungal Smear: NONE SEEN

## 2010-11-26 LAB — CULTURE, RESPIRATORY W GRAM STAIN

## 2010-11-26 LAB — PHOSPHORUS
Phosphorus: 2.4 mg/dL (ref 2.3–4.6)
Phosphorus: 2.5 mg/dL (ref 2.3–4.6)
Phosphorus: 3.6 mg/dL (ref 2.3–4.6)

## 2010-11-26 LAB — BODY FLUID CULTURE: Culture: NO GROWTH

## 2010-11-26 LAB — CARBOXYHEMOGLOBIN
Carboxyhemoglobin: 1.5 % (ref 0.5–1.5)
O2 Saturation: 59.9 %
Total hemoglobin: 8.2 g/dL — ABNORMAL LOW (ref 12.5–16.0)

## 2010-11-26 LAB — HEMOGLOBIN AND HEMATOCRIT, BLOOD
HCT: 24.9 % — ABNORMAL LOW (ref 36.0–46.0)
Hemoglobin: 8.7 g/dL — ABNORMAL LOW (ref 12.0–15.0)

## 2010-11-26 LAB — CARDIAC PANEL(CRET KIN+CKTOT+MB+TROPI)
Total CK: 2120 U/L — ABNORMAL HIGH (ref 7–177)
Troponin I: 0.11 ng/mL — ABNORMAL HIGH (ref 0.00–0.06)

## 2010-11-26 LAB — BRAIN NATRIURETIC PEPTIDE: Pro B Natriuretic peptide (BNP): 2469 pg/mL — ABNORMAL HIGH (ref 0.0–100.0)

## 2010-11-26 LAB — GRAM STAIN

## 2010-11-26 LAB — PLATELET COUNT: Platelets: 103 10*3/uL — ABNORMAL LOW (ref 150–400)

## 2010-11-26 LAB — HEMOCCULT GUIAC POC 1CARD (OFFICE)
Fecal Occult Bld: POSITIVE
Fecal Occult Bld: POSITIVE

## 2010-11-26 LAB — DIGOXIN LEVEL: Digoxin Level: 1.2 ng/mL (ref 0.8–2.0)

## 2010-11-26 LAB — LACTIC ACID, PLASMA: Lactic Acid, Venous: 1 mmol/L (ref 0.5–2.2)

## 2010-11-26 LAB — AFB CULTURE WITH SMEAR (NOT AT ARMC)

## 2010-11-26 LAB — HEMATOCRIT, BODY FLUID: Hematocrit, Fluid: 12 %

## 2010-11-27 LAB — PROTIME-INR
INR: 1.56 — ABNORMAL HIGH (ref 0.00–1.49)
Prothrombin Time: 18.5 seconds — ABNORMAL HIGH (ref 11.6–15.2)

## 2010-11-27 LAB — TYPE AND SCREEN
ABO/RH(D): A POS
Antibody Screen: NEGATIVE

## 2010-11-27 LAB — URINALYSIS, ROUTINE W REFLEX MICROSCOPIC
Bilirubin Urine: NEGATIVE
Glucose, UA: NEGATIVE mg/dL
Hgb urine dipstick: NEGATIVE
Ketones, ur: NEGATIVE mg/dL
Nitrite: NEGATIVE
Protein, ur: NEGATIVE mg/dL
Specific Gravity, Urine: 1.007 (ref 1.005–1.030)
Urobilinogen, UA: 0.2 mg/dL (ref 0.0–1.0)
pH: 7.5 (ref 5.0–8.0)

## 2010-11-27 LAB — CBC
HCT: 30.9 % — ABNORMAL LOW (ref 36.0–46.0)
Hemoglobin: 10.3 g/dL — ABNORMAL LOW (ref 12.0–15.0)
MCH: 28.2 pg (ref 26.0–34.0)
MCHC: 33.2 g/dL (ref 30.0–36.0)
MCV: 84.8 fL (ref 78.0–100.0)
Platelets: 157 10*3/uL (ref 150–400)
RBC: 3.65 MIL/uL — ABNORMAL LOW (ref 3.87–5.11)
RDW: 14.4 % (ref 11.5–15.5)
WBC: 5.2 10*3/uL (ref 4.0–10.5)

## 2010-11-27 LAB — BLOOD GAS, ARTERIAL
Acid-Base Excess: 4 mmol/L — ABNORMAL HIGH (ref 0.0–2.0)
Bicarbonate: 28 mEq/L — ABNORMAL HIGH (ref 20.0–24.0)
Drawn by: 206361
FIO2: 0.21 %
O2 Saturation: 97.4 %
Patient temperature: 98.6
TCO2: 29.3 mmol/L (ref 0–100)
pCO2 arterial: 42 mmHg (ref 35.0–45.0)
pH, Arterial: 7.439 — ABNORMAL HIGH (ref 7.350–7.400)
pO2, Arterial: 80.2 mmHg (ref 80.0–100.0)

## 2010-11-27 LAB — APTT: aPTT: 31 seconds (ref 24–37)

## 2010-11-27 LAB — HEMOGLOBIN A1C
Hgb A1c MFr Bld: 5.4 % (ref <5.7)
Mean Plasma Glucose: 108 mg/dL (ref <117)

## 2010-11-27 LAB — COMPREHENSIVE METABOLIC PANEL
ALT: 11 U/L (ref 0–35)
AST: 22 U/L (ref 0–37)
Albumin: 3.6 g/dL (ref 3.5–5.2)
Alkaline Phosphatase: 54 U/L (ref 39–117)
BUN: 21 mg/dL (ref 6–23)
CO2: 28 mEq/L (ref 19–32)
Calcium: 9.3 mg/dL (ref 8.4–10.5)
Chloride: 101 mEq/L (ref 96–112)
Creatinine, Ser: 0.98 mg/dL (ref 0.4–1.2)
GFR calc Af Amer: >60 mL/min (ref >60)
GFR calc non Af Amer: 55 mL/min — ABNORMAL LOW (ref >60)
Glucose, Bld: 118 mg/dL — ABNORMAL HIGH (ref 70–99)
Potassium: 3.4 mEq/L — ABNORMAL LOW (ref 3.5–5.1)
Sodium: 137 mEq/L (ref 135–145)
Total Bilirubin: 1.1 mg/dL (ref 0.3–1.2)
Total Protein: 6.4 g/dL (ref 6.0–8.3)

## 2010-11-27 LAB — ABO/RH: ABO/RH(D): A POS

## 2010-11-27 LAB — SURGICAL PCR SCREEN
MRSA, PCR: NEGATIVE
Staphylococcus aureus: NEGATIVE

## 2010-11-28 ENCOUNTER — Encounter: Payer: Self-pay | Admitting: Internal Medicine

## 2010-11-28 ENCOUNTER — Encounter (INDEPENDENT_AMBULATORY_CARE_PROVIDER_SITE_OTHER): Payer: Medicare Other

## 2010-11-28 DIAGNOSIS — Z7901 Long term (current) use of anticoagulants: Secondary | ICD-10-CM

## 2010-11-28 DIAGNOSIS — I4891 Unspecified atrial fibrillation: Secondary | ICD-10-CM

## 2010-11-28 LAB — PROTIME-INR: INR: 1.16 (ref 0.00–1.49)

## 2010-11-28 LAB — CBC
HCT: 27.5 % — ABNORMAL LOW (ref 36.0–46.0)
MCHC: 34 g/dL (ref 30.0–36.0)
MCV: 88 fL (ref 78.0–100.0)
Platelets: 133 10*3/uL — ABNORMAL LOW (ref 150–400)
RDW: 14.1 % (ref 11.5–15.5)

## 2010-11-28 LAB — POCT I-STAT 3, ART BLOOD GAS (G3+)
O2 Saturation: 92 %
pCO2 arterial: 40.6 mmHg (ref 35.0–45.0)
pH, Arterial: 7.396 (ref 7.350–7.400)
pO2, Arterial: 64 mmHg — ABNORMAL LOW (ref 80.0–100.0)

## 2010-11-28 LAB — POCT I-STAT 3, VENOUS BLOOD GAS (G3P V)
Bicarbonate: 26.4 mEq/L — ABNORMAL HIGH (ref 20.0–24.0)
pH, Ven: 7.359 — ABNORMAL HIGH (ref 7.250–7.300)

## 2010-11-28 LAB — POCT I-STAT, CHEM 8
BUN: 12 mg/dL (ref 6–23)
Calcium, Ion: 1.23 mmol/L (ref 1.12–1.32)
Chloride: 106 mEq/L (ref 96–112)
Creatinine, Ser: 1 mg/dL (ref 0.4–1.2)
Glucose, Bld: 96 mg/dL (ref 70–99)

## 2010-11-28 LAB — DIFFERENTIAL
Basophils Absolute: 0 10*3/uL (ref 0.0–0.1)
Eosinophils Absolute: 0.1 10*3/uL (ref 0.0–0.7)
Eosinophils Relative: 2 % (ref 0–5)

## 2010-11-28 LAB — CONVERTED CEMR LAB: POC INR: 1.8

## 2010-12-08 NOTE — Medication Information (Signed)
Summary: rov/tm  Anticoagulant Therapy  Managed by: Windell Hummingbird, RN Referring MD: Hillis Range, MD PCP: Gordy Levan Supervising MD: Tenny Craw MD, Gunnar Fusi Indication 1: Mechanical Prosthetic Heart Valve (prevent systemic emboli) Indication 2: Atrial Fibrillation Valve Type: Bioprosthetic Lab Used: [facility] INR POC 1.8 INR RANGE 2.0-3.0  Dietary changes: no    Health status changes: no    Bleeding/hemorrhagic complications: no    Recent/future hospitalizations: no    Any changes in medication regimen? no    Recent/future dental: no  Any missed doses?: no       Is patient compliant with meds? yes       Allergies: 1)  ! * Hydrrocodone 2)  ! Crestor  Anticoagulation Management History:      The patient is taking warfarin and comes in today for a routine follow up visit.  Positive risk factors for bleeding include an age of 75 years or older and presence of serious comorbidities.  The bleeding index is 'intermediate risk'.  Positive CHADS2 values include History of HTN and Age > 19 years old.  Anticoagulation responsible provider: Tenny Craw MD, Gunnar Fusi.  INR POC: 1.8.  Cuvette Lot#: [id number].  Exp: 11/2011.    Anticoagulation Management Assessment/Plan:      The patient's current anticoagulation dose is Warfarin sodium 2 mg tabs: Use as directed by Anticoagualtion Clinic.  The target INR is 2.0-3.0.  The next INR is due 12/13/2010.  Anticoagulation instructions were given to spouse.  Results were reviewed/authorized by Windell Hummingbird, RN.  She was notified by Windell Hummingbird, RN.         Prior Anticoagulation Instructions: INR 1.9 Today take 4mg s then resume 2mg s daily except 3mg s on Mondays and Thursdays. Recheck in 2 weeks.   Current Anticoagulation Instructions: INR 1.9 Begin taking 2 mg daily, except take 3 mg on Mondays, Wednesdays, and Fridays. Recheck in 2 weeks.

## 2010-12-13 ENCOUNTER — Ambulatory Visit (INDEPENDENT_AMBULATORY_CARE_PROVIDER_SITE_OTHER): Payer: Medicare Other | Admitting: *Deleted

## 2010-12-13 DIAGNOSIS — Z7901 Long term (current) use of anticoagulants: Secondary | ICD-10-CM

## 2010-12-13 DIAGNOSIS — I4891 Unspecified atrial fibrillation: Secondary | ICD-10-CM

## 2010-12-13 LAB — POCT INR: INR: 2.1

## 2011-01-03 ENCOUNTER — Ambulatory Visit (INDEPENDENT_AMBULATORY_CARE_PROVIDER_SITE_OTHER): Payer: Medicare Other | Admitting: *Deleted

## 2011-01-03 DIAGNOSIS — I4891 Unspecified atrial fibrillation: Secondary | ICD-10-CM

## 2011-01-17 ENCOUNTER — Ambulatory Visit (INDEPENDENT_AMBULATORY_CARE_PROVIDER_SITE_OTHER): Payer: Medicare Other | Admitting: *Deleted

## 2011-01-17 DIAGNOSIS — I4891 Unspecified atrial fibrillation: Secondary | ICD-10-CM

## 2011-01-24 NOTE — Assessment & Plan Note (Signed)
OFFICE VISIT   Savannah Roberts, Savannah Roberts  DOB:  1929-08-27                                        May 12, 2010  CHART #:  [id number]   HISTORY:  The patient returns to the office today after aortic valve  replacement with a #21 pericardial tissue valve done March 29, 2010.  She  was, at the time of surgery, very frail individual and had a long  complicated postoperative course.  She has known severe lower extremity  ischemic disease, right greater than left, ended up with some gangrenous  changes and mummification of several toes.  Ultimately, she was  discharged home on Coumadin as monitored by Dr. Jenel Lucks office.  She  comes into the office today for followup visit, doing remarkably well.  Still not much appetite, but not in obvious heart failure.  She is now  no longer using her home oxygen, though she still has it at home.  Her  respiratory status continues to improve.  Most of this is under the  excellent nursing care provided by her husband who has been very  diligent about her care.   PHYSICAL EXAMINATION:  Her blood pressure 121/64, pulse 72, respiratory  rate 16, and O2 sats 98% that is on room air.  On exam, her lungs are  clear.  Sternum is stable and well healed.  Valve sounds are crisp  without any murmur of aortic insufficiency.  She has lost one nail on  her right ring finger and has several loose nails probably related to  vascular disease in the period of hypoperfusion.  In addition, the  ischemic changes were noted in her feet and evaluated by Dr. Arbie Cookey.  The  left foot is improving much faster than the right.  The right great,  second, and third toe have mummified tips, but without any active  infection and overall seem improved from several weeks ago.  The left  foot is also markedly improved without any obvious areas of skin loss.   IMPRESSION:  A frail patient, now early post aortic valve replacement,  who seems to be improving without  overt symptoms of congestive heart  failure.  She is being maintained on Coumadin 2 mg a day.  She continues  with Silvadene dressing changes to the right foot, is taking dietary  supplements at home and overall is improving.  She lives in [place] and  prefers to get most of her care there.  We will ask Dr. Logan Bores, who has  seen her for vascular problems in the past, to check on her feet  especially the right foot.  At this point, I think continued local care  in time before any kind of intervention would be appropriate.  We will  plan to see her back in the office on May 23, 2010, and again  June 16, 2010.   Sheliah Plane, MD  Electronically Signed   EG/MEDQ  D:  05/12/2010  T:  05/13/2010  Job:  [id number]   cc:   Hillis Range, MD  Larina Earthly, MD

## 2011-01-24 NOTE — Assessment & Plan Note (Signed)
OFFICE VISIT   Savannah Roberts, Savannah Roberts  DOB:  1929/05/03                                        July 21, 2010  CHART #:  [id number]   HISTORY:  The patient returns to the office today in followup after her  aortic valve replacement with a #21 pericardial tissue valve done on  March 29, 2000.  She continues to slowly improve.  She is able to get  around her house relatively well.  The areas of necrosis of her toes  have completely resolved with exception of the great, second, and third  toe of the right foot and this continues to improve.  She has had no  overt symptoms of congestive heart failure.   PHYSICAL EXAMINATION:  Her blood pressure 154/70, pulse is 80,  respiratory rate is 16, and O2 sats 99%.  Her sternum is stable and well  healed.  Valve sounds are crisp.  I do not appreciate a murmur of aortic  insufficiency.  On exam of the lower extremities, as noted, she has area  of necrotic eschar on the tips of her right great, second, and third  toes that without infection.  That appeared to be slowly healing.  The  areas on her left foot are completely healed.   MEDICATIONS:  She continues on Coumadin as monitored by Dr. Jenel Lucks  office 2 mg a day.  In addition, she continues on aspirin 81 mg a day,  Pletal twice a day, Cardizem 100, Lopressor 50 b.i.d., Protonix 40 once  a day, B12 once a month, and digoxin 0.125 mg per day.   IMPRESSION:  Overall, I am very pleased with her progress.  I plan to  see her back in 3 months.  She notes that she saw Dr. Johney Frame yesterday  and is to have a followup echo in September 28, 2010.  Overall, considering her protracted postoperative course, she seems to  be doing relatively well at this point and returning to her more normal  activities.   Sheliah Plane, MD  Electronically Signed   EG/MEDQ  D:  07/21/2010  T:  07/22/2010  Job:  [id number]   cc:   Hillis Range, MD  Roney Marion, MD

## 2011-01-24 NOTE — Assessment & Plan Note (Signed)
OFFICE VISIT   LOUCINDA, Savannah Roberts  DOB:  1929/05/23                                        June 17, 2010  CHART #:  [id number]   HISTORY:  The patient returns to the office today in followup after her  aortic valve replacement on March 29, 2000, with a long postoperative  course.  Considering her preoperative fragile status and prolonged  postoperative course, she seems to be doing relatively well at this  time.  She has no symptoms of heart failure.  She is able to do  activities around her house.  She had developed dry gangrenous changes  in both feet and her fingertips postoperatively.  This continues to  improve with the excellent local care provided by her husband.   PHYSICAL EXAMINATION:  Her blood pressure is 172/81, pulse is 66,  respiratory rate is 18, and O2 sats 99%.  Her valve sounds are crisp,  but I do not appreciate any murmur of aortic insufficiency.  Her pulse  feels regular.  She had been in atrial fibrillation and is on Coumadin  for this.  We did not do an EKG in the office today.  Her sternum is  stable and well healed.  Abdominal exam is benign.  She has no pedal  edema.  On examination of her toes and feet, the left foot now has  completely healed and I could not detect that she had had any gangrenous  changes in her toes.  This is also the same with her fingertips.  She  still has some dry eschar on the great, second, and third toes.  There  does not appear any active infection and it appears to be slowly  healing.   The patient, since has seen Dr. Logan Bores in [place] concerning her toes  and who recommended that she continue as were doing.  I agree with this  and it appears to be slowly improving, and the patient was encouraged  that with time, it will completely heal.   She had a chest x-ray today that shows clear lung fields bilaterally.   MEDICATIONS:  Remain unchanged.  She continues on Hyzaar; potassium;  digoxin; Coumadin 2 mg a  day as directed by Dr. Jenel Lucks office; Lasix  has been cut to 20 mg a day; she is on 81 mg of aspirin twice a day;  Cardizem; Lopressor; Protonix; Silvadene cream; Ultram p.r.n.; and  vitamin B12.   IMPRESSION:  Overall, I am pleased with her slow, but steady progress  especially considering her very frail preoperative condition.  We will  plan to see her back in approximately 4 weeks.  Her husband is aware to  call us should he need anything before that time.   Sheliah Plane, MD  Electronically Signed   EG/MEDQ  D:  06/17/2010  T:  06/18/2010  Job:  [id number]   cc:   Gerlene Burdock A. Logan Bores, MD  Hillis Range, MD  Roney Marion, MD

## 2011-01-24 NOTE — Assessment & Plan Note (Signed)
OFFICE VISIT   VIRGIN, ZELLERS  DOB:  April 06, 1929                                        May 23, 2010  CHART #:  [id number]   HISTORY OF PRESENTING ILLNESS:  This is a 77 year old Caucasian female  who is status post AVR by Dr. Tyrone Sage on March 29, 2010.  She was last  seen in the office for routine follow up on May 12, 2010.  At that  time, her respiratory status was continuing to improve (she was no  longer requiring home O2), and she did have some gangrene his changes  and mummified toes on the right.  She was seen by Dr. Logan Bores who has seen  her in the past for her vascular problems (on May 18, 2010).  According to the patient, he recommended continued Silvadene dressing  changes to the right foot and that no further intervention was needed at  this time.   PHYSICAL EXAMINATION:  General:  This is a pleasant 77 year old  Caucasian female who is in no acute distress who is alert, oriented,  cooperative, and accompanied by her supportive husband.  Vital Signs:  BP 123/61, heart rate 80, respiration rate 16, O2 sat 95% on room air.  Cardiovascular:  Regular rate and rhythm.  Crisp valve click.  No  murmur.  Pulmonary:  Clear to auscultation bilaterally.  No rales,  wheezes, or rhonchi.  Abdomen:  Soft, nontender.  Bowel sounds present.  Sternal wound well healed.  Right great toe and second toes are  mummified at the tips and on the bottom only.  The third toe is mostly  mummified, there is no sign of active infection.  The right great toe  and second toes have definitely improved since her discharge from the  hospital.  She has slight erythema of the right lower extremity just  above the ankle.  She will most likely lose the toenails of the first,  second, third toe.   IMPRESSION AND PLAN:  The patient's right foot overall does continue to  improve with dressing changes and local wound care.  She will continue  to be followed by Dr. Logan Bores.   The patient's husband stated he did  decrease her Lasix to 20 mg per day.  He was instructed to contact  cardiology office for further instructions regarding this as well as a  potassium supplement.  She is going to return to see Dr. Tyrone Sage the  first week of October.  We will obtain a chest x-ray  prior to this appointment.  She will continue to be followed by Dr.  Jenel Lucks office regarding further Coumadin therapy.   Doree Fudge, PA   DZ/MEDQ  D:  05/23/2010  T:  05/24/2010  Job:  [id number]   cc:   Nilda Riggs, MD  Hillis Range, MD

## 2011-01-24 NOTE — Consult Note (Signed)
NEW PATIENT CONSULTATION   Savannah Roberts, Savannah Roberts  DOB:  12/11/1928                                        February 25, 2010  CHART #:  [id number]   FOLLOWUP CARDIOLOGIST:  Hillis Range, MD   PRIMARY CARE PHYSICIAN:  Dr. Lawana Pai, Rosalita Levan.   REASON FOR CONSULTATION:  Severe aortic stenosis with syncope.   HISTORY OF PRESENT ILLNESS:  The patient is a 77 year old female who  more than a year ago had episodes of dizziness and near syncope.  She  was found to have significant aortic stenosis, but was reluctant to seek  any further evaluation of this.  In the spring of this year, she began  to have noting additional episodes of dizziness, increasing shortness of  breath if she stopped her Lasix, and increasing pedal edema, but has  been particularly bothered by increasing dyspnea on exertion to the  point where she can do very little even around the house.  She has noted  nocturnal dyspnea on occasion.  She has had no previous history of  myocardial infarction, no previous angioplasty or cardiac surgery.   Cardiac risk factors:  She has known hypertension, denies diabetes,  known hyperlipidemia treated.  She has a remote smoking history, but  quit more than 30 years ago.  Denies any previous history of stroke,  claudication, or renal insufficiency.   PAST MEDICAL HISTORY:  1. She did have history of atrial fibrillation after appendectomy      several years ago.  2. History of right cold leg, treated with Coumadin, this was in      November 2008.  She denies any operative intervention for and since      that time has had pain in her right leg with ambulation.  3. History of carcinoma of the left breast 6 years ago, treated with      left mastectomy, was treated with chemo, and was told she had      negative nodes.  Final pathologic staging is unknown.  She      continues to be followed by Dr. Gilman Buttner in [place].   PAST SURGICAL HISTORY:  1. Left mastectomy 6 years  ago.  2. Appendectomy.   FAMILY HISTORY:  The patient's father died of a myocardial infarction at  age 38.  She had 2 brothers, one died of sudden death at age 60.  One  died while having heart surgery in his 33s.  The patient's mother died  at age 67 with cancer of the bladder.   SOCIAL HISTORY:  The patient denies alcohol, retired as a Psychiatric nurse  at [employer], has 2 healthy children, has good family support.   CURRENT MEDICATIONS:  1. Hyzaar 50/12.5 a day.  2. Potassium 20 mEq a day.  3. Digoxin 0.125 mg a day.  4. Coumadin 4 mg day.  5. Lasix 40 mg a day.  6. Fenofibrate 160 mg.  7. Cyanocobalamin 1000 mcg a day.  8. Nadolol 40 mg a day.  9. Ferrous gluconate 1 per day.   ALLERGIES:  Hydrocodone.  The patient does not remember what this  allergy is.  She does note with Crestor she gets muscle aches and is  unable to take statins.   REVIEW OF SYSTEMS:  CARDIAC:  Negative for chest pain or resting  shortness of breath.  She has exertional shortness of breath, orthopnea,  has had episodes of syncope, did note palpitations when she had episode  of atrial fibrillation after appendectomy, does have mild lower  extremity edema.  GENERAL:  She denies fever, chills, or night sweats, but she has had  significant weight loss up to 10-15 pounds over the past year.  She  denies any change in vision.  Denies hemoptysis.  Denies abdominal pain,  but has had loss of appetite.  Denies hematuria.  Denies joint pain or  swelling.  Denies psychiatric history.  Other review of systems is  negative.   PHYSICAL EXAMINATION:  General:  The patient is a very thin, frail  appearing 77 year old female.  Vital Signs:  Blood pressure 151/70,  pulse is 78 and regular, respiratory rate is 18, and O2 sats 95%.  Her  weight is 106 pounds.  Neurologic:  The patient is awake, alert, and  neurologically intact.  I do not appreciate any carotid bruits.  Lungs:  Clear bilaterally.  She does have  significant kyphosis.  Cardiac:  Regular rate and rhythm with a holosystolic murmur heard best along the  right sternal border consistent with aortic insufficiency.  There is a  2/6 murmur of aortic insufficiency at the apex of the heart.  Abdomen:  Thin, scaphoid abdomen without palpable masses.  There is no obvious  organomegaly.  She has no right femoral pulses, very slight bruising in  left groin from her cardiac catheterization with a palpable left femoral  pulse.  I do not feel distal pulses in the right leg, but reports  viable.  She has faint DP and PT pulses on the left.   DIAGNOSTIC TESTS:  Cardiac catheterization films are reviewed.  Echocardiogram dated Jan 20, 2009, shows moderate hypokinesis with  moderate left ventricular hypertrophy, moderate aortic insufficiency,  severe aortic stenosis, moderate mitral valve regurgitation, calcified  mitral valve annulus, peak aortic valve velocity is 375 cm/sec.  Stress  test is dated Jan 20, 2009, showed normal nuclear study.  Cardiac  catheterization was done on February 10, 2010, showed a PA pressure of 74/32,  mean of 49, pulmonary wedge pressure of 25. Cardiac index 2.4,  calculated aortic valve area of 0.66 with a mean aortic gradient of  27.45.  The left main coronary artery was short, LAD was a large vessel  with luminal irregularities with small diagonal, the circumflex had a  proximal 25% stenosis.  The right coronary artery was totally occluded  with collateralization.   IMPRESSION:  Frail-appearing 77 year old female with symptomatic  critical aortic stenosis and coronary artery disease with occluded right  coronary artery and history of breast cancer treated with mastectomy and  chemotherapy.   SUGGESTIONS:  I have discussed with the patient and her husband in  detail about the risks and options and natural history of critical  aortic stenosis.  She has now been definitely symptomatic from her  aortic stenosis for more than  a year and definitely has had a decrease  in overall functional status because of it.  She is reluctant to have  any intervention done.  I have reviewed with her the option of aortic  valve replacement.  I have discussed with her the various trials for  percutaneous placement of aortic valves also.  I have discussed her case  with the investigators at [facility] on the CoreValve trial and she would  probably be a candidate to be randomized in the high risk group, but  this  would not absolutely sure that she would have a percutaneous valve  placed and if she would have some vascular issues with the occluded  right femoral artery and probably small left femoral artery, a left  subclavian approach could be taken.  After this discussion, she was  reluctant to agree to proceeding with aortic valve replacement.  At this  point, I have encouraged her that very soon is the time to make a  decision as if we wait an extended period of time.  She will likely  continue to deteriorate to the point where the possibility of surgical  intervention and good outcome would be impossible.  She is agreeable to  return to the office in 2 weeks after she thinks about her options to  further discuss and make a decision.   Sheliah Plane, MD  Electronically Signed   EG/MEDQ  D:  02/25/2010  T:  02/26/2010  Job:  [id number]   cc:   Roney Marion, MD  Hillis Range, MD  Dellia Beckwith, M.D.

## 2011-01-31 ENCOUNTER — Ambulatory Visit (INDEPENDENT_AMBULATORY_CARE_PROVIDER_SITE_OTHER): Payer: Medicare Other | Admitting: *Deleted

## 2011-01-31 DIAGNOSIS — I4891 Unspecified atrial fibrillation: Secondary | ICD-10-CM

## 2011-01-31 DIAGNOSIS — Z7901 Long term (current) use of anticoagulants: Secondary | ICD-10-CM | POA: Insufficient documentation

## 2011-02-09 ENCOUNTER — Ambulatory Visit (INDEPENDENT_AMBULATORY_CARE_PROVIDER_SITE_OTHER): Payer: Medicare Other | Admitting: Cardiothoracic Surgery

## 2011-02-09 DIAGNOSIS — I359 Nonrheumatic aortic valve disorder, unspecified: Secondary | ICD-10-CM

## 2011-02-09 NOTE — Assessment & Plan Note (Signed)
OFFICE VISIT  Savannah Roberts, Savannah Roberts DOB:  1928/10/02                                        Feb 09, 2011 CHART #:  [id number]  The patient returns to the office today in followup after aortic valve replacement with a #21 pericardial tissue valve done on March 29, 2010. Considering her a very long postoperative course now almost 8 months postop she is continues to improve.  She has now completed 30-cardiac rehab visits and has just a couple more left.  Overall both she and her husband are pleased with her functional status, which continues to improve.  At this point, the major sequelae from her operation are still on the right to great toe, second and third toes, have small amount of eschar at the tips it is gradually healing.  She is able to ambulate without difficulty.  She is no longer using her home oxygen.  She has no overt symptoms of congestive heart failure.  Denies pedal edema.  On exam, her blood pressure 157/88, pulse 97, respiratory rates 18, and O2 sats 98% on room air.  Her sternum is stable and well-healed.  Heart sounds are crisp.  I do not appreciate any murmur of aortic insufficiency.  Abdominal exam is benign without palpable masses.  The left foot is now completely healed.  She has small amount of eschar is noted remaining on the tip of her great toe, second and third toes on the right foot without evidence of infection with the excellent care. Her husband has been providing and continued Silvadene.  There is no evidence of infection and the eschar appears to be loosening and probably over the next several months will continue to heal without further intervention.  She continues on potassium, Coumadin, Lasix, _________, aspirin, Pletal, Cardizem, Lopressor, Ultram, and vitamin B12.  Overall I am very pleased with her progress.  We will plan to see her back in 3 months.  In the meantime, we will discontinue her home oxygen and cut down her home  nurse visits to just one time a week.  Sheliah Plane, MD Electronically Signed  EG/MEDQ  D:  02/09/2011  T:  02/09/2011  Job:  [id number]  cc:   Hillis Range, MD

## 2011-02-13 ENCOUNTER — Ambulatory Visit (INDEPENDENT_AMBULATORY_CARE_PROVIDER_SITE_OTHER): Payer: Medicare Other | Admitting: *Deleted

## 2011-02-13 ENCOUNTER — Ambulatory Visit (INDEPENDENT_AMBULATORY_CARE_PROVIDER_SITE_OTHER): Payer: Medicare Other | Admitting: Internal Medicine

## 2011-02-13 DIAGNOSIS — I10 Essential (primary) hypertension: Secondary | ICD-10-CM

## 2011-02-13 DIAGNOSIS — I4891 Unspecified atrial fibrillation: Secondary | ICD-10-CM

## 2011-02-13 DIAGNOSIS — E781 Pure hyperglyceridemia: Secondary | ICD-10-CM

## 2011-02-13 DIAGNOSIS — I359 Nonrheumatic aortic valve disorder, unspecified: Secondary | ICD-10-CM

## 2011-02-13 MED ORDER — FUROSEMIDE 20 MG PO TABS: 20.0000 mg | ORAL_TABLET | Freq: Every day | ORAL | Status: DC

## 2011-02-13 NOTE — Assessment & Plan Note (Signed)
Continue fenofibrate She has not tolerated crestor previously due to myalgias

## 2011-02-13 NOTE — Assessment & Plan Note (Signed)
Stable No change required today  

## 2011-02-13 NOTE — Progress Notes (Signed)
The patient presents today for routine cardiology followup.  Since last being seen in our clinic, the patient reports doing very well.  Today, she denies symptoms of palpitations, chest pain, shortness of breath, orthopnea, PND, lower extremity edema, dizziness, presyncope, syncope, or neurologic sequela.  The patient feels that she is tolerating medications without difficulties and is otherwise without complaint today.   Past Medical History  Diagnosis Date  . Aortic stenosis   . Atrial fibrillation   . PAC (premature atrial contraction)   . PVC (premature ventricular contraction)   . Hypertension   . Dizziness   . GERD (gastroesophageal reflux disease)   . DVT (deep vein thrombosis) in pregnancy   . Emphysema   . Breast cancer   . S/P aortic valve replacement   . S/P mastectomy    Past Surgical History  Procedure Date  . Appendectomy   . Mastectomy   . Aortic valve replacement     Current Outpatient Prescriptions  Medication Sig Dispense Refill  . aspirin 81 MG tablet Take 81 mg by mouth daily.        . cilostazol (PLETAL) 100 MG tablet Take 100 mg by mouth 2 (two) times daily.        . Cyanocobalamin (VITAMIN B-12 IJ) Inject as directed every 30 (thirty) days.        Marland Kitchen diltiazem (CARDIZEM CD) 180 MG 24 hr capsule Take 180 mg by mouth daily.        . fenofibrate 160 MG tablet Take 160 mg by mouth daily.        . furosemide (LASIX) 20 MG tablet Take 1 tablet (20 mg total) by mouth daily.  90 tablet  3  . Iron Combinations (FE PLUS PROTEIN) 25 MG TABS Take by mouth 2 (two) times daily.        . metoprolol (LOPRESSOR) 50 MG tablet Take 50 mg by mouth 2 (two) times daily.        . Nutritional Supplements (BOOST PLUS PO) Take by mouth as needed.        . potassium chloride (K-DUR) 10 MEQ tablet Take 10 mEq by mouth daily.        . silver sulfADIAZINE (SILVADENE) 1 % cream Apply topically daily.        . traMADol (ULTRAM) 50 MG tablet Take 50 mg by mouth every 6 (six) hours as  needed.        . warfarin (COUMADIN) 2 MG tablet Take by mouth as directed.       Marland Kitchen DISCONTD: furosemide (LASIX) 20 MG tablet Take 20 mg by mouth daily.        Marland Kitchen DISCONTD: digoxin (LANOXIN) 0.25 MG tablet Take 250 mcg by mouth. 1/2 tablet daily       . DISCONTD: metoprolol (LOPRESSOR) 100 MG tablet Take 100 mg by mouth 2 (two) times daily.        Marland Kitchen DISCONTD: pantoprazole (PROTONIX) 40 MG tablet Take 40 mg by mouth daily.          Allergies  Allergen Reactions  . Hydrocodone   . Rosuvastatin     REACTION: Reaction not known    History   Social History  . Marital Status: Married    Spouse Name: N/A    Number of Children: N/A  . Years of Education: N/A   Occupational History  . Not on file.   Social History Main Topics  . Smoking status: Former Games developer  . Smokeless tobacco: Not on file  .  Alcohol Use: No  . Drug Use: No  . Sexually Active:    Other Topics Concern  . Not on file   Social History Narrative  . No narrative on file    Family History  Problem Relation Age of Onset  . Breast cancer Mother   . Stroke Sister   . Diabetes Brother   . Heart attack Brother    Physical Exam: Filed Vitals:   02/13/11 1113  BP: 124/65  Pulse: 98  Resp: 14  Height: 5\' 4"  (1.626 m)  Weight: 106 lb (48.081 kg)    GEN- The patient is thin appearing, alert and oriented x 3 today.   Head- normocephalic, atraumatic Eyes-  Sclera clear, conjunctiva pink Ears- hearing intact Oropharynx- clear Neck- supple, no JVP Lymph- no cervical lymphadenopathy Lungs- Clear to ausculation bilaterally, normal work of breathing Heart- irregular rate and rhythm, 2/6 SEM LUSB early peaking, 3/6 SEM at the apex, PMI not laterally displaced GI- soft, NT, ND, + BS Extremities- no clubbing, cyanosis, or edema MS- no significant deformity or atrophy Skin- no rash or lesion Psych- euthymic mood, full affect Neuro- strength and sensation are intact  Assessment and Plan:

## 2011-02-13 NOTE — Assessment & Plan Note (Signed)
She continues to improve Continue cardiac rehab and activity as tolerated

## 2011-02-13 NOTE — Patient Instructions (Signed)
Your physician wants you to follow-up in: 4 months with Dr Allred You will receive a reminder letter in the mail two months in advance. If you don't receive a letter, please call our office to schedule the follow-up appointment.  

## 2011-02-13 NOTE — Assessment & Plan Note (Signed)
Stable Continue coumadin longterm 

## 2011-02-28 ENCOUNTER — Encounter: Payer: Self-pay | Admitting: Internal Medicine

## 2011-03-13 ENCOUNTER — Ambulatory Visit (INDEPENDENT_AMBULATORY_CARE_PROVIDER_SITE_OTHER): Payer: Medicare Other | Admitting: *Deleted

## 2011-03-13 DIAGNOSIS — I4891 Unspecified atrial fibrillation: Secondary | ICD-10-CM

## 2011-03-13 LAB — POCT INR: INR: 2.6

## 2011-03-13 MED ORDER — WARFARIN SODIUM 3 MG PO TABS: ORAL_TABLET | ORAL | Status: DC

## 2011-03-30 IMAGING — CR DG CHEST 2V
2 series · 2 of 2 positions shown · non-contrast
Comparison: 04/12/2010

CLINICAL DATA: Aortic valve replacement

CHEST - 2 VIEW

[w chest pa]
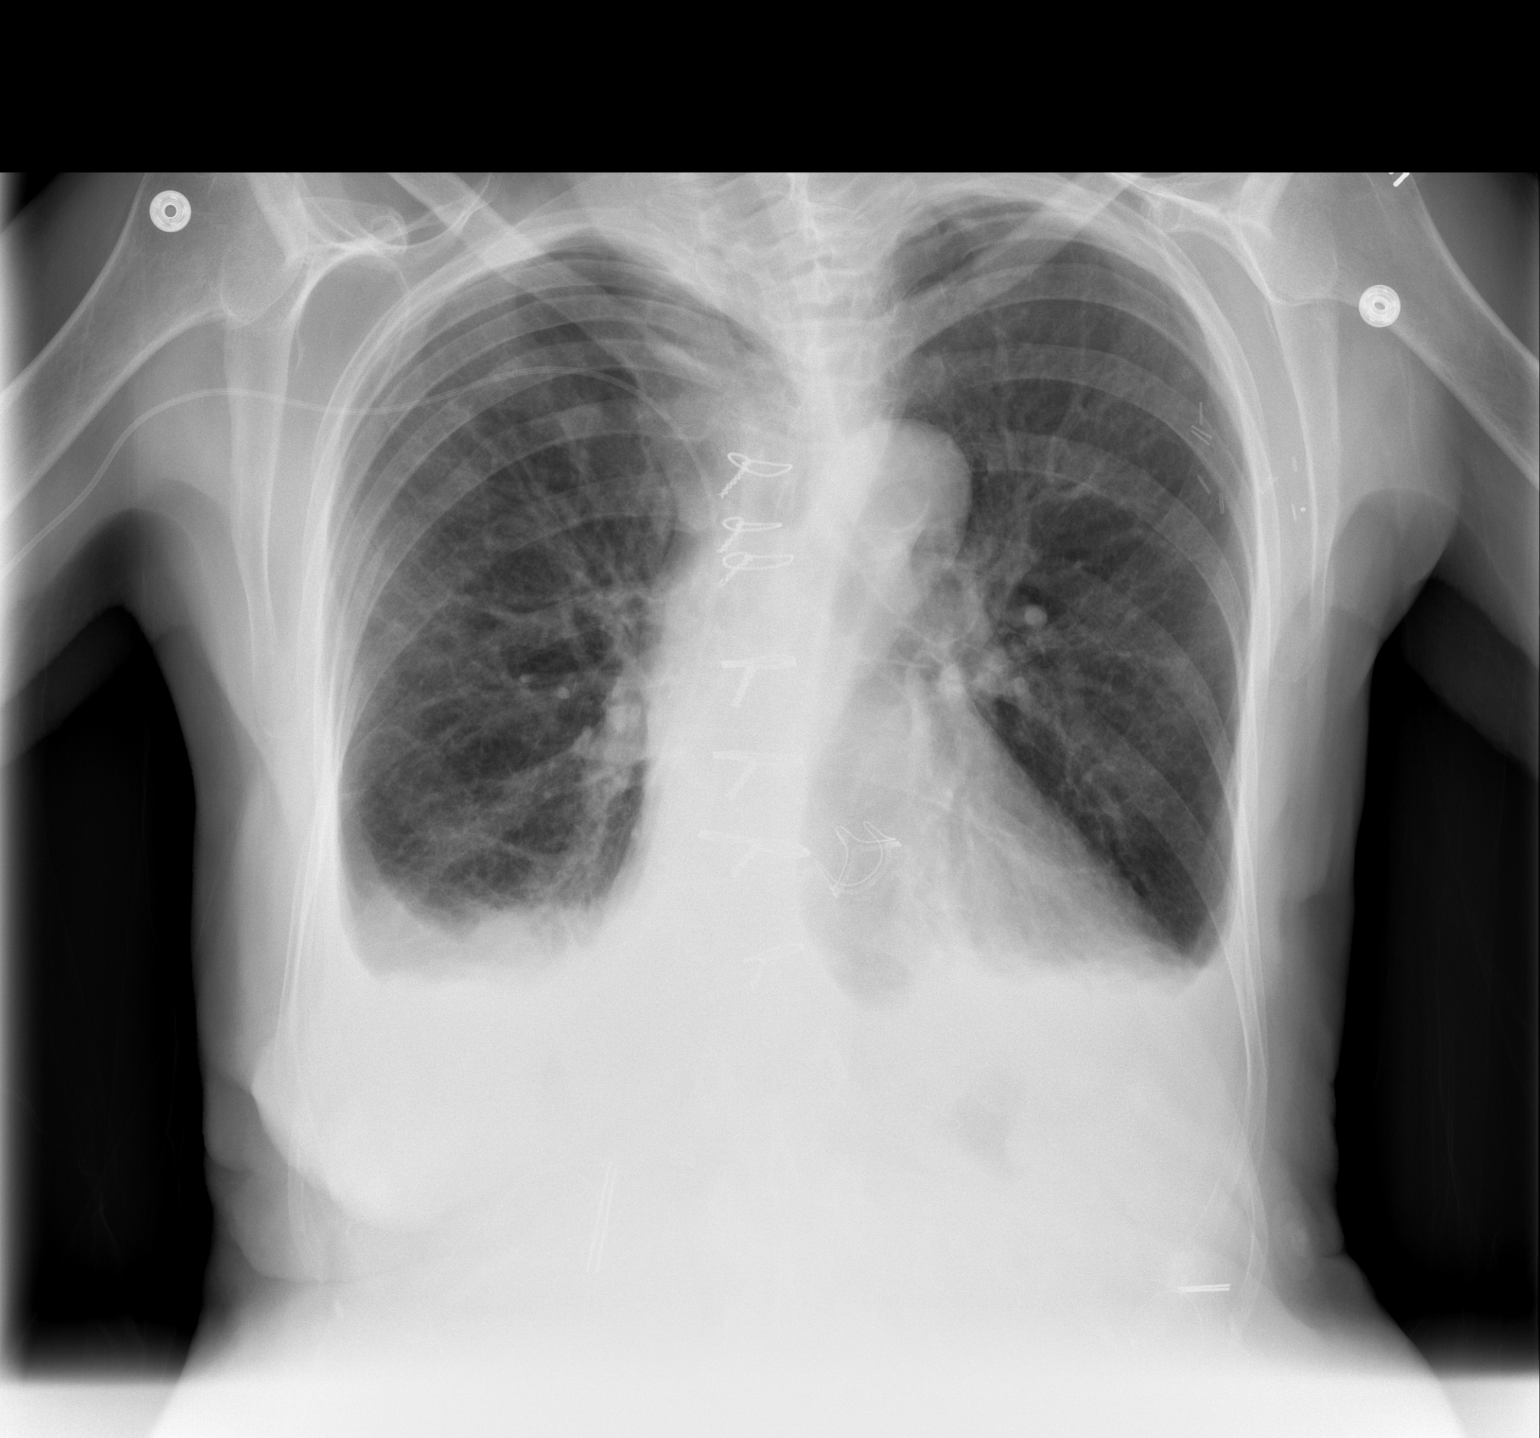

[w chest lat]
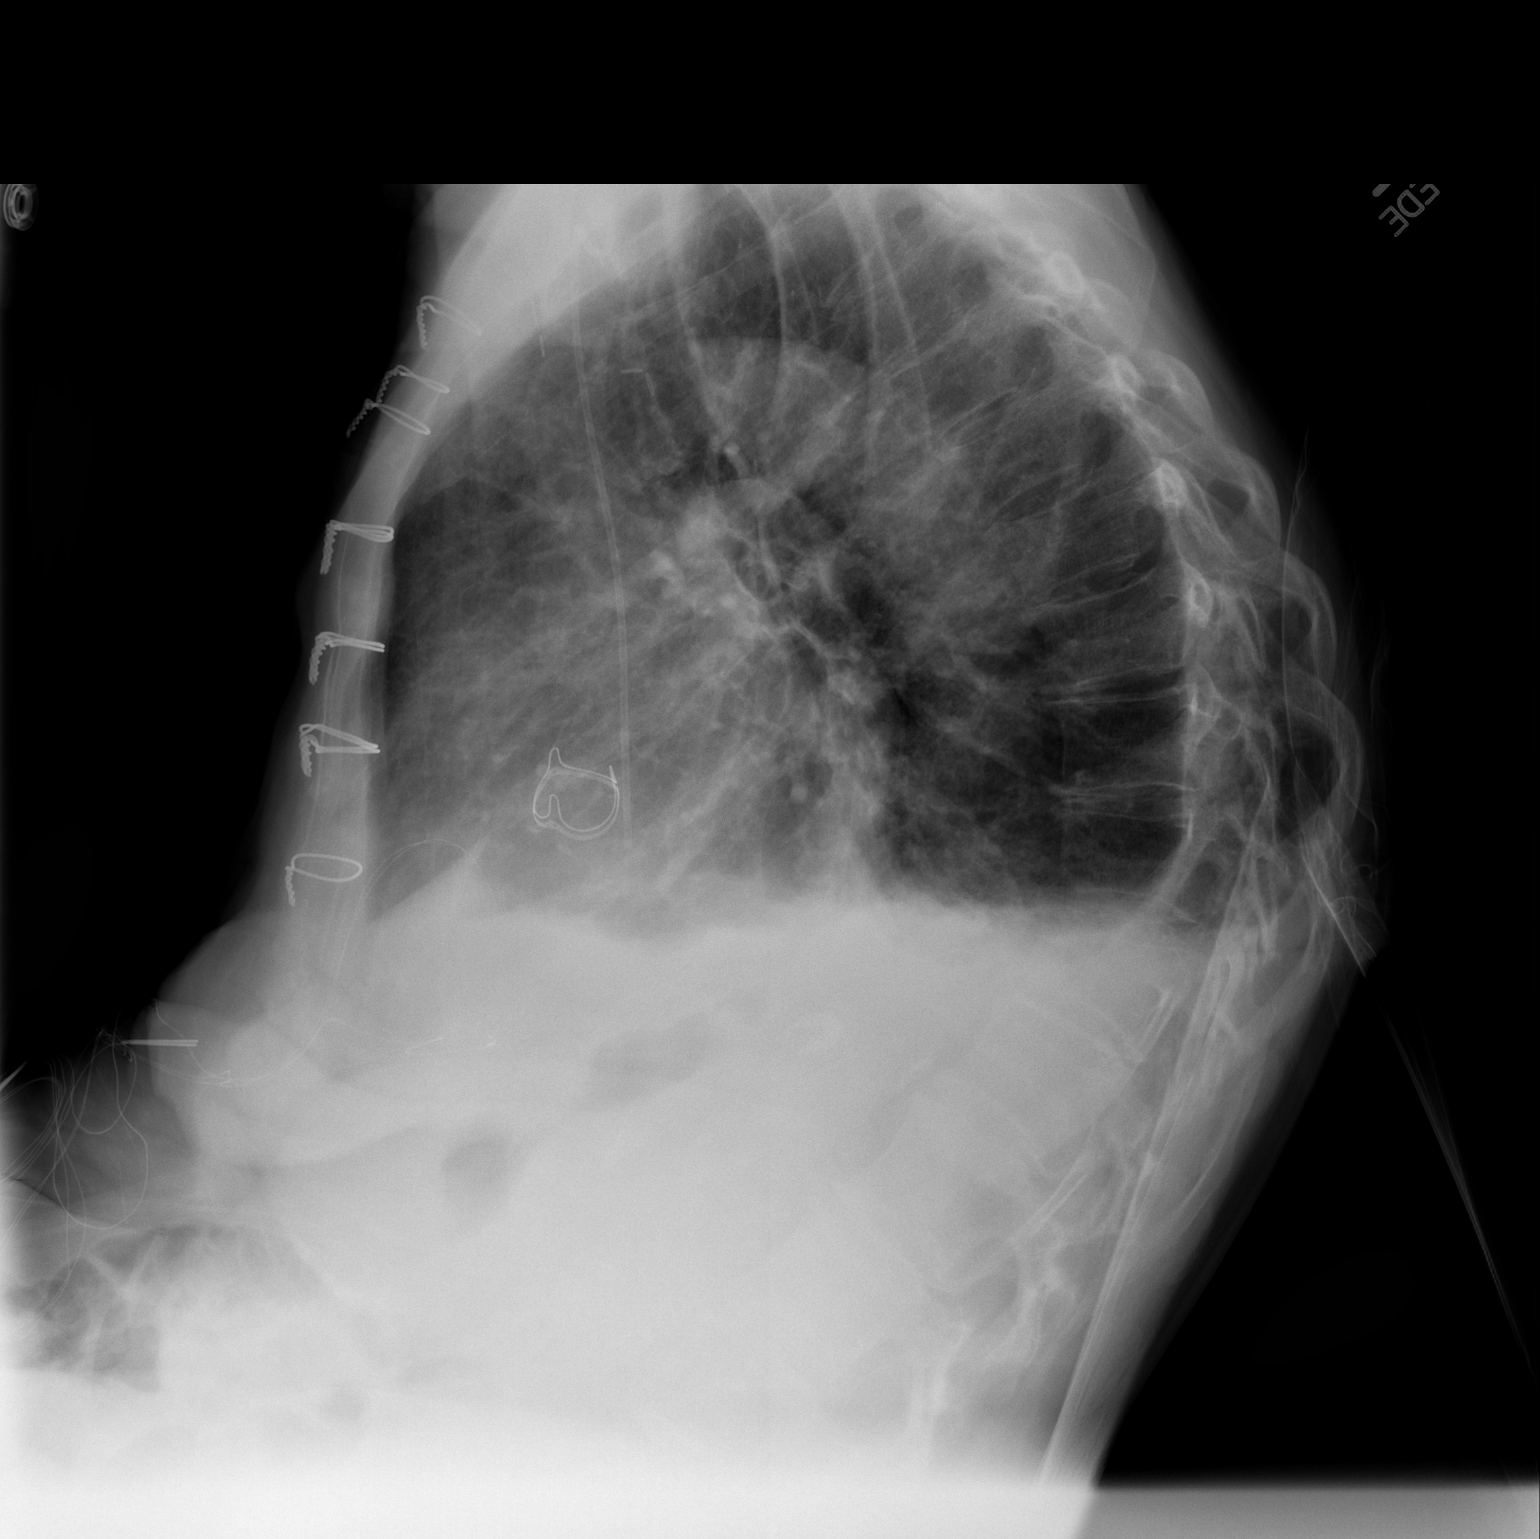

[2 of 2 positions shown; findings below may reference images not displayed]

FINDINGS: There is a right arm PICC line with tip in the cavoatrial
junction.

There are bilateral pleural effusions which are unchanged from
prior exam.

No interstitial edema or airspace consolidation.
IMPRESSION: 1.  Persistent bilateral pleural effusions.

## 2011-04-05 IMAGING — CR DG CHEST 2V
1 series · 1 of 1 positions shown · non-contrast
Comparison: 04/13/2010

CLINICAL DATA: Recent CABG.  Short of breath.

CHEST - 2 VIEW

[view not recorded]
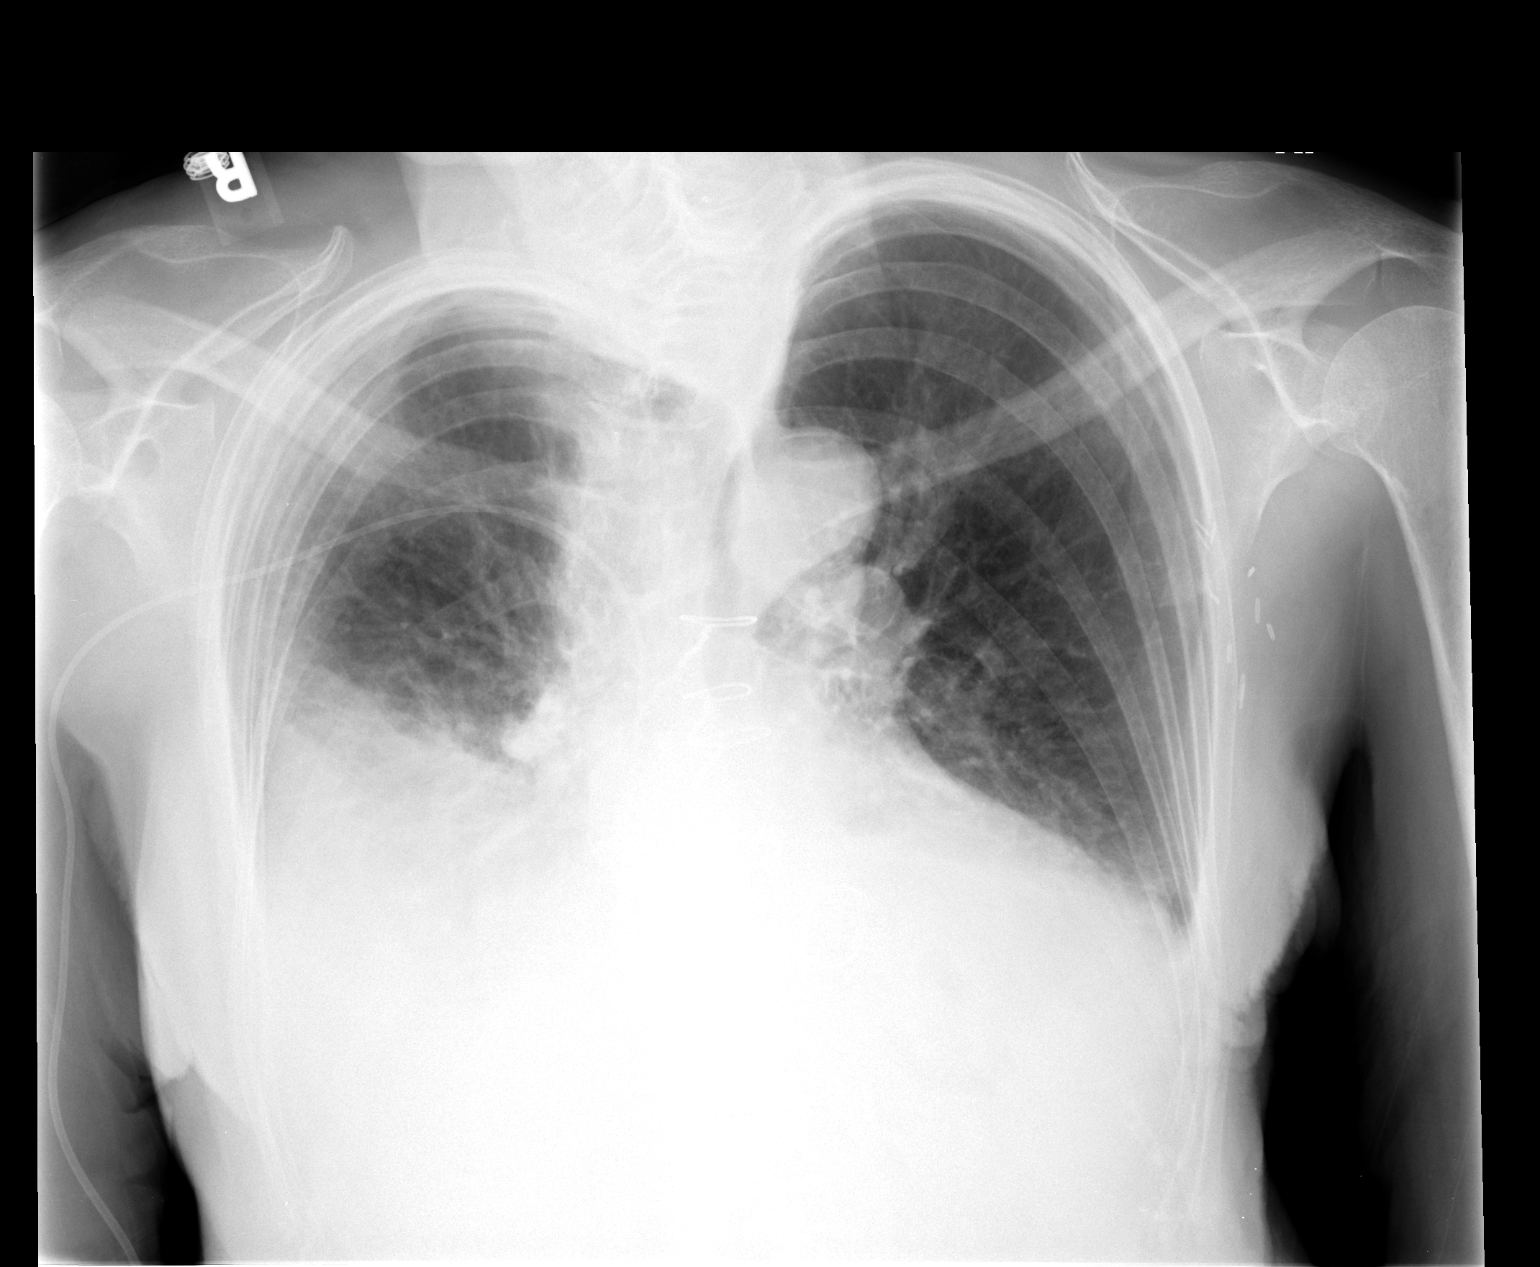

[1 of 1 positions shown; findings below may reference images not displayed]

FINDINGS: Persistent pleural effusions bilaterally with interval
enlargement of the right pleural effusion.  There is increased
bibasilar and right middle lobe atelectasis since the prior study.
There is no edema.  PICC line tip remains in the SVC.
IMPRESSION: Increasing right pleural effusion and right lower lobe atelectasis.

Increase in left lower lobe atelectasis .

## 2011-04-10 IMAGING — CR DG CHEST 2V
2 series · 2 of 2 positions shown · non-contrast
Comparison: Chest x-ray of 04/21/2010

CLINICAL DATA: Right pleural effusion, follow-up

CHEST - 2 VIEW

[w chest pa]
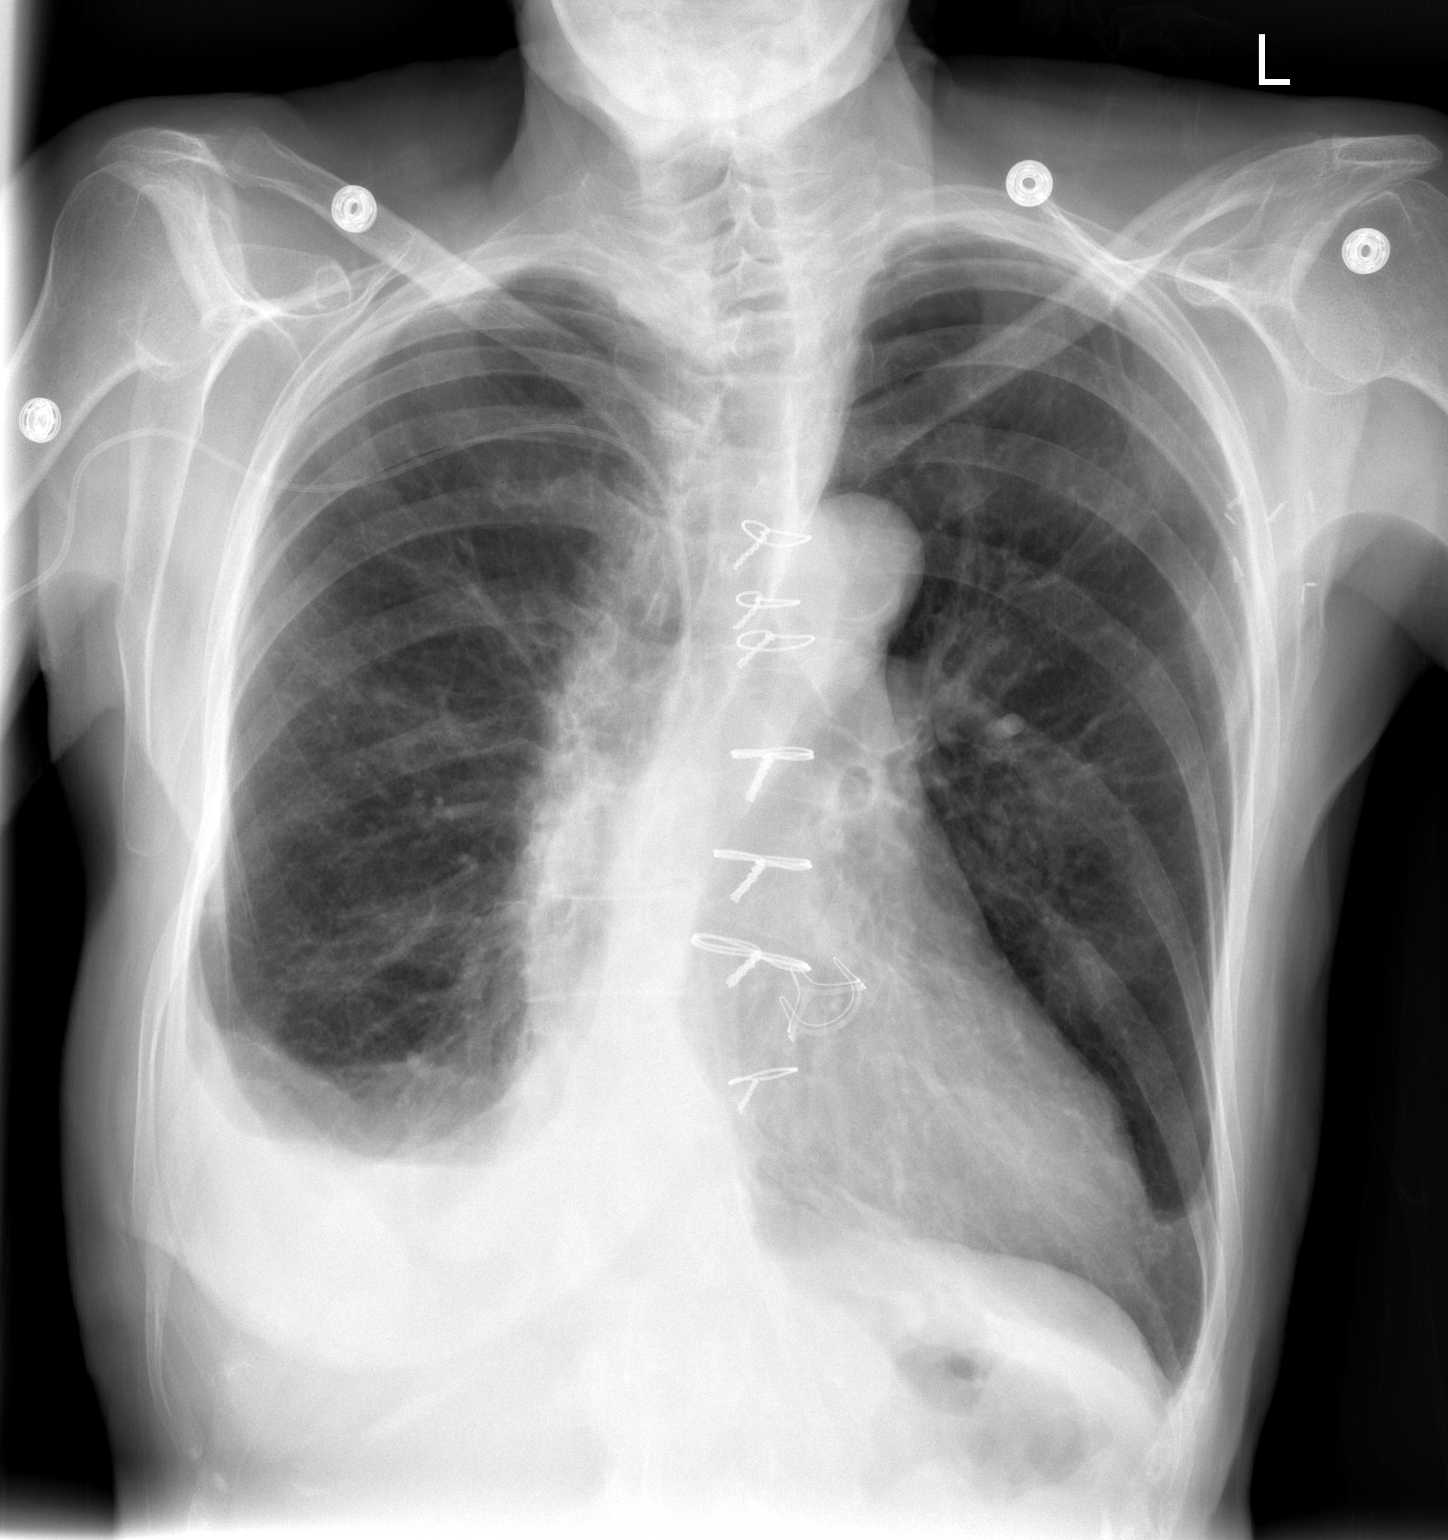

[w chest lat]
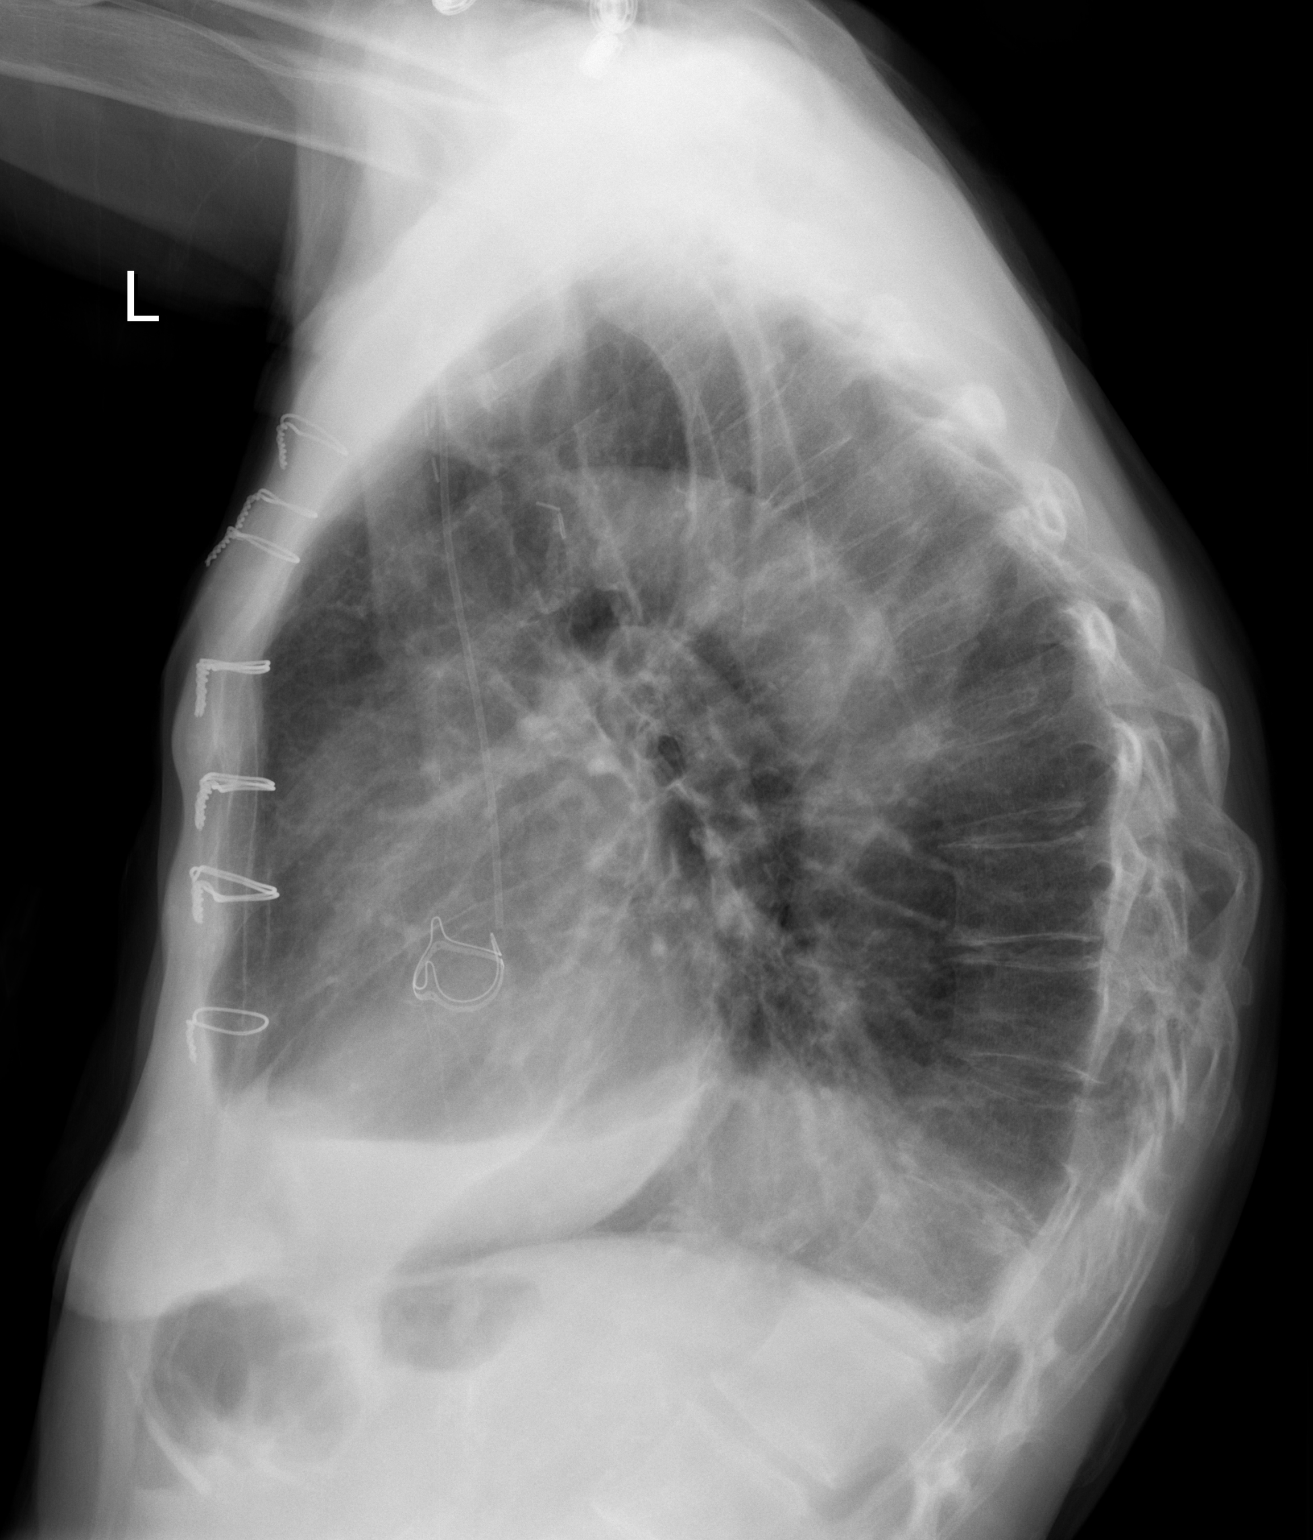

[2 of 2 positions shown; findings below may reference images not displayed]

FINDINGS: Aeration has improved.  Small right effusion remains with
mild right basilar atelectasis.  Cardiomegaly is stable.  Aortic
valve replacement is noted.  Right central venous line remains
unchanged in position.
IMPRESSION: Improved aeration.  Small right effusion.

## 2011-04-11 ENCOUNTER — Ambulatory Visit (INDEPENDENT_AMBULATORY_CARE_PROVIDER_SITE_OTHER): Payer: Medicare Other | Admitting: *Deleted

## 2011-04-11 DIAGNOSIS — I4891 Unspecified atrial fibrillation: Secondary | ICD-10-CM

## 2011-04-11 LAB — POCT INR: INR: 3

## 2011-05-09 ENCOUNTER — Ambulatory Visit (INDEPENDENT_AMBULATORY_CARE_PROVIDER_SITE_OTHER): Payer: Medicare Other | Admitting: *Deleted

## 2011-05-09 DIAGNOSIS — I4891 Unspecified atrial fibrillation: Secondary | ICD-10-CM

## 2011-05-09 LAB — POCT INR: INR: 2.7

## 2011-05-10 DIAGNOSIS — I4891 Unspecified atrial fibrillation: Secondary | ICD-10-CM

## 2011-05-10 DIAGNOSIS — I062 Rheumatic aortic stenosis with insufficiency: Secondary | ICD-10-CM

## 2011-05-11 ENCOUNTER — Ambulatory Visit (INDEPENDENT_AMBULATORY_CARE_PROVIDER_SITE_OTHER): Payer: Medicare Other | Admitting: Cardiothoracic Surgery

## 2011-05-11 ENCOUNTER — Encounter: Payer: Self-pay | Admitting: Cardiothoracic Surgery

## 2011-05-11 VITALS — BP 150/91 | HR 90 | Resp 20 | Ht 64.0 in | Wt 108.0 lb

## 2011-05-11 DIAGNOSIS — Z954 Presence of other heart-valve replacement: Secondary | ICD-10-CM

## 2011-05-11 DIAGNOSIS — I062 Rheumatic aortic stenosis with insufficiency: Secondary | ICD-10-CM

## 2011-05-11 DIAGNOSIS — Z952 Presence of prosthetic heart valve: Secondary | ICD-10-CM

## 2011-05-11 DIAGNOSIS — I739 Peripheral vascular disease, unspecified: Secondary | ICD-10-CM

## 2011-05-11 DIAGNOSIS — I359 Nonrheumatic aortic valve disorder, unspecified: Secondary | ICD-10-CM

## 2011-05-11 DIAGNOSIS — J302 Other seasonal allergic rhinitis: Secondary | ICD-10-CM | POA: Insufficient documentation

## 2011-05-11 NOTE — Patient Instructions (Addendum)
No changes continue wound care Return 3 months

## 2011-05-11 NOTE — Progress Notes (Deleted)
Rowe Clack Date of Birth: [birth year]  Allred, Quita Skye, MD Marylen Ponto, MD  Chief Complaint:  Chief Complaint  Patient presents with  . Routine Post Op    S/P Aortic valve replacement with a #21 pericardial tissue valve (03/29/2010),   History of Present Illness:  Past Medical History  Diagnosis Date  . Atrial fibrillation   . PAC (premature atrial contraction)   . PVC (premature ventricular contraction)   . Hypertension   . Dizziness   . GERD (gastroesophageal reflux disease)   . DVT (deep vein thrombosis) in pregnancy   . Emphysema   . S/P aortic valve replacement   . S/P mastectomy   . Aortic stenosis   . Breast cancer     Past Surgical History  Procedure Date  . Appendectomy   . Mastectomy   . Aortic valve replacement 03/29/2010     #21 pericardial tissue valve [employer] model #3300TFX serical number [id number]    History  Smoking status  . Former Smoker  Smokeless tobacco  . Not on file    History  Alcohol Use No    Allergies  Allergen Reactions  . Hydrocodone   . Rosuvastatin     REACTION: Reaction not known    Current Outpatient Prescriptions  Medication Sig Dispense Refill  . aspirin 81 MG tablet Take 81 mg by mouth daily.        . cilostazol (PLETAL) 100 MG tablet Take 100 mg by mouth 2 (two) times daily.        . Cyanocobalamin (VITAMIN B-12 IJ) Inject as directed every 30 (thirty) days.        Marland Kitchen diltiazem (CARDIZEM CD) 180 MG 24 hr capsule Take 180 mg by mouth daily.        . fenofibrate 160 MG tablet Take 160 mg by mouth daily.        . fluticasone (VERAMYST) 27.5 MCG/SPRAY nasal spray Place 2 sprays into the nose daily.        . furosemide (LASIX) 20 MG tablet Take 1 tablet (20 mg total) by mouth daily.  90 tablet  3  . Iron Combinations (FE PLUS PROTEIN) 25 MG TABS Take by mouth 2 (two) times daily.        . metoprolol (LOPRESSOR) 50 MG tablet Take 50 mg by mouth 2 (two) times daily.        . Nutritional Supplements (BOOST  PLUS PO) Take by mouth as needed.        . potassium chloride (K-DUR) 10 MEQ tablet Take 10 mEq by mouth daily.        . silver sulfADIAZINE (SILVADENE) 1 % cream Apply topically daily.        . traMADol (ULTRAM) 50 MG tablet Take 50 mg by mouth every 6 (six) hours as needed.        . warfarin (COUMADIN) 2 MG tablet Take by mouth as directed.       . warfarin (COUMADIN) 3 MG tablet Take as directed by Anticoagulation clinic  90 tablet  1     Family History  Problem Relation Age of Onset  . Breast cancer Mother   . Stroke Sister   . Diabetes Brother   . Heart attack Brother     Review of Systems:  {Ros - cvs:5918} {ROS CARDIOVASCULAR DISEASE:315737}  Physical Exam:  BP 150/91  Pulse 90  Resp 20  Ht 5\' 4"  (1.626 m)  Wt 108 lb (48.988 kg)  BMI 18.54  kg/m2  SpO2 99%  {Physical Exam:3041110} {neuro:3041450} {physical exam:3041130}  LABORATORY DATA:   Assessment / Plan:

## 2011-05-11 NOTE — Progress Notes (Signed)
  HPI  Patient returns for routine postoperative follow-up having undergone heart valve replacement July 2011. She has been followed since that time because of necrotic toes that have been healing slowly. Her husband has been taking excellent care of her wounds with good results   Current Outpatient Prescriptions  Medication Sig Dispense Refill  . aspirin 81 MG tablet Take 81 mg by mouth daily.        . cilostazol (PLETAL) 100 MG tablet Take 100 mg by mouth 2 (two) times daily.        . Cyanocobalamin (VITAMIN B-12 IJ) Inject as directed every 30 (thirty) days.        Marland Kitchen diltiazem (CARDIZEM CD) 180 MG 24 hr capsule Take 180 mg by mouth daily.        . fenofibrate 160 MG tablet Take 160 mg by mouth daily.        . fluticasone (VERAMYST) 27.5 MCG/SPRAY nasal spray Place 2 sprays into the nose daily.        . furosemide (LASIX) 20 MG tablet Take 1 tablet (20 mg total) by mouth daily.  90 tablet  3  . Iron Combinations (FE PLUS PROTEIN) 25 MG TABS Take by mouth 2 (two) times daily.        . metoprolol (LOPRESSOR) 50 MG tablet Take 50 mg by mouth 2 (two) times daily.        . Nutritional Supplements (BOOST PLUS PO) Take by mouth as needed.        . potassium chloride (K-DUR) 10 MEQ tablet Take 10 mEq by mouth daily.        . silver sulfADIAZINE (SILVADENE) 1 % cream Apply topically daily.        . traMADol (ULTRAM) 50 MG tablet Take 50 mg by mouth every 6 (six) hours as needed.        . warfarin (COUMADIN) 2 MG tablet Take by mouth as directed.       . warfarin (COUMADIN) 3 MG tablet Take as directed by Anticoagulation clinic  90 tablet  1      Physical Exam BP 150/91  Pulse 90  Resp 20  Ht 5\' 4"  (1.626 m)  Wt 108 lb (48.988 kg)  BMI 18.54 kg/m2  SpO2 99% The patient's lungs are clear bilaterally cardiac exam shows a regular rate and rhythm. No murmur of aortic insufficiency appreciate she has no pedal edema her toes have all healed with the exception of the right third toe chest small  amount of necrotic eschar at the tip.  Diagnostic tests:  None  Impression: Stable status post aortic valve replacement  Plan:   Continue with current wound care will plan to see back in 3 months

## 2011-06-06 ENCOUNTER — Ambulatory Visit (INDEPENDENT_AMBULATORY_CARE_PROVIDER_SITE_OTHER): Payer: Medicare Other | Admitting: *Deleted

## 2011-06-06 DIAGNOSIS — I4891 Unspecified atrial fibrillation: Secondary | ICD-10-CM

## 2011-06-14 ENCOUNTER — Ambulatory Visit (INDEPENDENT_AMBULATORY_CARE_PROVIDER_SITE_OTHER): Payer: Medicare Other | Admitting: Internal Medicine

## 2011-06-14 ENCOUNTER — Encounter (INDEPENDENT_AMBULATORY_CARE_PROVIDER_SITE_OTHER): Payer: Medicare Other

## 2011-06-14 ENCOUNTER — Encounter: Payer: Self-pay | Admitting: Internal Medicine

## 2011-06-14 DIAGNOSIS — I359 Nonrheumatic aortic valve disorder, unspecified: Secondary | ICD-10-CM

## 2011-06-14 DIAGNOSIS — R42 Dizziness and giddiness: Secondary | ICD-10-CM

## 2011-06-14 DIAGNOSIS — I4891 Unspecified atrial fibrillation: Secondary | ICD-10-CM

## 2011-06-14 DIAGNOSIS — I10 Essential (primary) hypertension: Secondary | ICD-10-CM

## 2011-06-14 LAB — BASIC METABOLIC PANEL
Calcium: 9.1 mg/dL (ref 8.4–10.5)
GFR: 40.41 mL/min — ABNORMAL LOW (ref >60.00)
Glucose, Bld: 103 mg/dL — ABNORMAL HIGH (ref 70–99)
Sodium: 143 mEq/L (ref 135–145)

## 2011-06-14 LAB — CBC WITH DIFFERENTIAL/PLATELET
Basophils Absolute: 0 10*3/uL (ref 0.0–0.1)
Eosinophils Relative: 3.5 % (ref 0.0–5.0)
HCT: 28.3 % — ABNORMAL LOW (ref 36.0–46.0)
Lymphocytes Relative: 29.9 % (ref 12.0–46.0)
Monocytes Relative: 12.2 % — ABNORMAL HIGH (ref 3.0–12.0)
Neutrophils Relative %: 53.9 % (ref 43.0–77.0)
Platelets: 184 10*3/uL (ref 150.0–400.0)
RDW: 16.5 % — ABNORMAL HIGH (ref 11.5–14.6)
WBC: 3.9 10*3/uL — ABNORMAL LOW (ref 4.5–10.5)

## 2011-06-14 MED ORDER — SILVER SULFADIAZINE 1 % EX CREA: TOPICAL_CREAM | Freq: Every day | CUTANEOUS | Status: DC

## 2011-06-14 NOTE — Patient Instructions (Signed)
Your physician recommends that you schedule a follow-up appointment in: 4 weeks with Dr Johney Frame  Your physician recommends that you return for lab work today cbc/bmp  Your physician has recommended that you wear an event monitor. Event monitors are medical devices that record the heart's electrical activity. Doctors most often [place] these monitors to diagnose arrhythmias. Arrhythmias are problems with the speed or rhythm of the heartbeat. The monitor is a small, portable device. You can wear one while you do your normal daily activities. This is usually used to diagnose what is causing palpitations/syncope (passing out). [phone]

## 2011-06-14 NOTE — Progress Notes (Signed)
The patient presents today for routine cardiology followup.  Since last being seen in our clinic, the patient reports doing very well.  She reports occasional dizziness not related to position.  She feels that she has associated palpitations and thinks that this is afib.  Today, she denies symptoms of chest pain, shortness of breath, orthopnea, PND, lower extremity edema,  presyncope, syncope, or neurologic sequela.  The patient feels that she is tolerating medications without difficulties and is otherwise without complaint today.   Past Medical History  Diagnosis Date  . Atrial fibrillation   . PAC (premature atrial contraction)   . PVC (premature ventricular contraction)   . Hypertension   . Dizziness   . GERD (gastroesophageal reflux disease)   . DVT (deep vein thrombosis) in pregnancy   . Emphysema   . S/P aortic valve replacement   . S/P mastectomy   . Aortic stenosis   . Breast cancer    Past Surgical History  Procedure Date  . Appendectomy   . Mastectomy   . Aortic valve replacement 03/29/2010     #21 pericardial tissue valve [employer] model #3300TFX serical number [id number]    Current Outpatient Prescriptions  Medication Sig Dispense Refill  . aspirin 81 MG tablet Take 81 mg by mouth daily.        . cilostazol (PLETAL) 100 MG tablet Take 100 mg by mouth 2 (two) times daily.        . Cyanocobalamin (VITAMIN B-12 IJ) Inject as directed every 30 (thirty) days.        Marland Kitchen diltiazem (CARDIZEM CD) 180 MG 24 hr capsule Take 180 mg by mouth daily.        . fenofibrate 160 MG tablet Take 160 mg by mouth daily.        . fluticasone (VERAMYST) 27.5 MCG/SPRAY nasal spray Place 2 sprays into the nose daily.        . furosemide (LASIX) 20 MG tablet Take 1 tablet (20 mg total) by mouth daily.  90 tablet  3  . Iron Combinations (FE PLUS PROTEIN) 25 MG TABS Take by mouth 2 (two) times daily.        . metoprolol (LOPRESSOR) 50 MG tablet Take 50 mg by mouth 2 (two) times daily.          . Nutritional Supplements (BOOST PLUS PO) Take by mouth as needed.        . potassium chloride (K-DUR) 10 MEQ tablet Take 10 mEq by mouth daily.        . silver sulfADIAZINE (SILVADENE) 1 % cream Apply topically daily.        . traMADol (ULTRAM) 50 MG tablet Take 50 mg by mouth every 6 (six) hours as needed.        . warfarin (COUMADIN) 2 MG tablet Take by mouth as directed.       . warfarin (COUMADIN) 3 MG tablet Take as directed by Anticoagulation clinic  90 tablet  1    Allergies  Allergen Reactions  . Hydrocodone   . Rosuvastatin     REACTION: Reaction not known    History   Social History  . Marital Status: Married    Spouse Name: N/A    Number of Children: N/A  . Years of Education: N/A   Occupational History  . Not on file.   Social History Main Topics  . Smoking status: Former Games developer  . Smokeless tobacco: Not on file  . Alcohol Use: No  .  Drug Use: No  . Sexually Active:    Other Topics Concern  . Not on file   Social History Narrative  . No narrative on file    Family History  Problem Relation Age of Onset  . Breast cancer Mother   . Stroke Sister   . Diabetes Brother   . Heart attack Brother    Physical Exam: Filed Vitals:   06/14/11 1055  BP: 143/78  Pulse: 96  Height: 5\' 2"  (1.575 m)  Weight: 110 lb (49.896 kg)    GEN- The patient is thin appearing, alert and oriented x 3 today.   Head- normocephalic, atraumatic Eyes-  Sclera clear, conjunctiva pink Ears- hearing intact Oropharynx- clear Neck- supple, no JVP Lymph- no cervical lymphadenopathy Lungs- Clear to ausculation bilaterally, normal work of breathing Heart- irregular rate and rhythm, 2/6 SEM LUSB early peaking, 3/6 SEM at the apex, PMI not laterally displaced GI- soft, NT, ND, + BS Extremities- no clubbing, cyanosis, or edema MS- no significant deformity or atrophy Skin- no rash or lesion Psych- euthymic mood, full affect Neuro- strength and sensation are intact  Assessment  and Plan:

## 2011-06-14 NOTE — Assessment & Plan Note (Signed)
Stable Continue coumadin 

## 2011-06-14 NOTE — Assessment & Plan Note (Signed)
21 day event monitor to evaluate nature/ rates with afib

## 2011-06-14 NOTE — Assessment & Plan Note (Signed)
Unclear etiology Does not appear dehydrated today  Check BMET and CBC today (she is chronically anemic) 21 day event monitor to evaluate rhythm

## 2011-06-14 NOTE — Assessment & Plan Note (Signed)
Stable No change required today  bmet today 

## 2011-06-15 ENCOUNTER — Telehealth: Payer: Self-pay | Admitting: Physician Assistant

## 2011-06-15 NOTE — Telephone Encounter (Signed)
Received call from Aiza at [facility] that patient had a 3.6 second post-conversion pause from afib to NSR, currently in SR. They also note she has had a few other ~3 second pauses. I spoke with pt's husband - Ms. Bucks is feeling fine but did have about two dizzy spells earlier. No syncope, CP, SOB. Symptoms resolved spontaneously. Her husband prefers not to proceed to ER with patient as these are the same symptoms she's been experiencing and are stable. I told them to proceed to hospital if symptoms persist or worsen. Patient and husband expressed understanding. Will forward to Dr. Johney Frame for review. Also called office voicemail to ensure this is seen by MD tomorrow in the event that Dr. Johney Frame is out.

## 2011-06-15 NOTE — Progress Notes (Signed)
Addended by: Kem Parkinson on: 06/15/2011 03:38 PM   Modules accepted: Orders

## 2011-06-16 ENCOUNTER — Telehealth: Payer: Self-pay

## 2011-06-16 NOTE — Telephone Encounter (Signed)
Late entry:  10:14 AM 06/16/11--I spoke with Dr Johney Frame and made him aware that the pt had a 3.6 second pause on monitor from 06/15/11.  This was also called to [facility].  Per Dr Johney Frame he would like the pt to stop Diltiazem at this time and continue wearing monitor.  I spoke with the pt's husband and made him aware of this information.

## 2011-06-19 ENCOUNTER — Telehealth: Payer: Self-pay | Admitting: Internal Medicine

## 2011-06-19 NOTE — Telephone Encounter (Signed)
This has already been taken care of.   This message was from after hours on Thurs night and was never taken off machine on Friday  Maryann Alar spoke with Dr Johney Frame regarding patient on Friday and her Diltiazem was stopped.  I spoke with her husband today after receiving this message and he has stopped Diltiazem and will have her to continue to wear the monitor

## 2011-06-19 NOTE — Telephone Encounter (Signed)
Pt called after hours requesting to know the next step

## 2011-07-04 ENCOUNTER — Ambulatory Visit (INDEPENDENT_AMBULATORY_CARE_PROVIDER_SITE_OTHER): Payer: Medicare Other | Admitting: *Deleted

## 2011-07-04 DIAGNOSIS — I4891 Unspecified atrial fibrillation: Secondary | ICD-10-CM

## 2011-07-04 DIAGNOSIS — Z7901 Long term (current) use of anticoagulants: Secondary | ICD-10-CM

## 2011-07-04 LAB — POCT INR: INR: 3.7

## 2011-07-12 ENCOUNTER — Encounter: Payer: Self-pay | Admitting: Internal Medicine

## 2011-07-12 ENCOUNTER — Ambulatory Visit (INDEPENDENT_AMBULATORY_CARE_PROVIDER_SITE_OTHER): Payer: Medicare Other | Admitting: Internal Medicine

## 2011-07-12 VITALS — BP 177/91 | HR 90 | Ht 64.0 in | Wt 108.8 lb

## 2011-07-12 DIAGNOSIS — I4891 Unspecified atrial fibrillation: Secondary | ICD-10-CM

## 2011-07-12 DIAGNOSIS — I495 Sick sinus syndrome: Secondary | ICD-10-CM

## 2011-07-12 DIAGNOSIS — I10 Essential (primary) hypertension: Secondary | ICD-10-CM

## 2011-07-12 MED ORDER — FUROSEMIDE 40 MG PO TABS: ORAL_TABLET | ORAL | Status: DC

## 2011-07-12 MED ORDER — AMIODARONE HCL 200 MG PO TABS: 200.0000 mg | ORAL_TABLET | Freq: Every day | ORAL | Status: DC

## 2011-07-12 MED ORDER — POTASSIUM CHLORIDE 20 MEQ PO PACK: 20.0000 meq | PACK | Freq: Every day | ORAL | Status: DC

## 2011-07-12 NOTE — Patient Instructions (Signed)
Your physician recommends that you schedule a follow-up appointment in: 4 weeks with Dr Johney Frame  Your physician recommends that you return for lab work today: BMP/LIVER/TSH/T4/INR  Your physician has recommended you make the following change in your medication:   1) Start Amiodarone 200mg  dailly 2) Increase Furosemide to 40mg  daily

## 2011-07-13 ENCOUNTER — Encounter: Payer: Self-pay | Admitting: Internal Medicine

## 2011-07-13 DIAGNOSIS — I495 Sick sinus syndrome: Secondary | ICD-10-CM | POA: Insufficient documentation

## 2011-07-13 LAB — BASIC METABOLIC PANEL
CO2: 31 mEq/L (ref 19–32)
Chloride: 106 mEq/L (ref 96–112)
Creatinine, Ser: 1.3 mg/dL — ABNORMAL HIGH (ref 0.4–1.2)
Potassium: 3.8 mEq/L (ref 3.5–5.1)
Sodium: 144 mEq/L (ref 135–145)

## 2011-07-13 LAB — HEPATIC FUNCTION PANEL
ALT: 14 U/L (ref 0–35)
Alkaline Phosphatase: 37 U/L — ABNORMAL LOW (ref 39–117)
Bilirubin, Direct: 0.1 mg/dL (ref 0.0–0.3)
Total Bilirubin: 0.5 mg/dL (ref 0.3–1.2)
Total Protein: 6.1 g/dL (ref 6.0–8.3)

## 2011-07-13 LAB — PROTIME-INR: Prothrombin Time: 41.2 s — ABNORMAL HIGH (ref 10.2–12.4)

## 2011-07-13 LAB — T4, FREE: Free T4: 1.21 ng/dL (ref 0.60–1.60)

## 2011-07-13 NOTE — Assessment & Plan Note (Signed)
The patient has symptomatic tachycardia bradycardia syndrome clearly documented with recent event monitor.  She has pauses > 4 seconds even after stopping diltiazem.  She also has significantly elevated heart rates with afib.  I have therefore recommend pacemaker implantation at this time.  Risks, benefits, alternatives to pacemaker implantation were discussed in detail with the patient and her spouse today. The patient understands that the risks and would like to avoid pacemaker implant at this time.  She is aware that pauses could lead to syncope which could result in serious injury.  Despite my recommendation, she declines PPM today.

## 2011-07-13 NOTE — Progress Notes (Signed)
The patient presents today for cardiology followup.  Since last being seen in our clinic, the patient reports doing reasonably well.  She wore an event monitor which revealed afib with tachybrady syndrome.  She was observed to have elevated heart rates (150s) as well as pauses > 4 seconds.  The pauses corresponded to her prior dizziness.  I stopped her cardizem and she reports having no further episodes of dizziness.  Today, she denies symptoms of chest pain, shortness of breath, orthopnea, PND, lower extremity edema,  presyncope, syncope, or neurologic sequela.  The patient feels that she is tolerating medications without difficulties and is otherwise without complaint today.   Past Medical History  Diagnosis Date  . Atrial fibrillation   . PAC (premature atrial contraction)   . PVC (premature ventricular contraction)   . Hypertension   . Tachycardia-bradycardia syndrome   . DVT (deep vein thrombosis) in pregnancy   . Emphysema   . S/P aortic valve replacement     for severe symptomatic aortic stenosis by Dr Tyrone Sage  . S/P mastectomy   . Breast cancer    Past Surgical History  Procedure Date  . Appendectomy   . Mastectomy   . Aortic valve replacement 03/29/2010     #21 pericardial tissue valve [employer] model #3300TFX serical number [id number]    Current Outpatient Prescriptions  Medication Sig Dispense Refill  . aspirin 81 MG tablet Take 81 mg by mouth daily.        . cilostazol (PLETAL) 100 MG tablet Take 100 mg by mouth 2 (two) times daily.        . Cyanocobalamin (VITAMIN B-12 IJ) Inject as directed every 30 (thirty) days.        . fenofibrate 160 MG tablet Take 160 mg by mouth daily.        . fluticasone (VERAMYST) 27.5 MCG/SPRAY nasal spray Place 2 sprays into the nose daily.        . furosemide (LASIX) 40 MG tablet Take one by mouth daily  90 tablet  3  . Iron Combinations (FE PLUS PROTEIN) 25 MG TABS Take by mouth 2 (two) times daily.        . metoprolol (LOPRESSOR)  50 MG tablet Take 50 mg by mouth 2 (two) times daily.        . Nutritional Supplements (BOOST PLUS PO) Take by mouth as needed.        . potassium chloride (KLOR-CON) 20 MEQ packet Take 20 mEq by mouth daily.  90 tablet  3  . silver sulfADIAZINE (SILVADENE) 1 % cream Apply topically daily.  400 g  3  . traMADol (ULTRAM) 50 MG tablet Take 50 mg by mouth every 6 (six) hours as needed.        . warfarin (COUMADIN) 2 MG tablet Take by mouth as directed.       . warfarin (COUMADIN) 3 MG tablet Take as directed by Anticoagulation clinic  90 tablet  1  . amiodarone (PACERONE) 200 MG tablet Take 1 tablet (200 mg total) by mouth daily.  30 tablet  3    Allergies  Allergen Reactions  . Hydrocodone   . Rosuvastatin     REACTION: Reaction not known    History   Social History  . Marital Status: Married    Spouse Name: N/A    Number of Children: N/A  . Years of Education: N/A   Occupational History  . Not on file.   Social History Main Topics  .  Smoking status: Former Games developer  . Smokeless tobacco: Not on file  . Alcohol Use: No  . Drug Use: No  . Sexually Active:    Other Topics Concern  . Not on file   Social History Narrative  . No narrative on file    Family History  Problem Relation Age of Onset  . Breast cancer Mother   . Stroke Sister   . Diabetes Brother   . Heart attack Brother    Physical Exam: Filed Vitals:   07/12/11 1512  BP: 177/91  Pulse: 90  Height: 5\' 4"  (1.626 m)  Weight: 108 lb 12.8 oz (49.351 kg)    GEN- The patient is thin appearing, alert and oriented x 3 today.   Head- normocephalic, atraumatic Eyes-  Sclera clear, conjunctiva pink Ears- hearing intact Oropharynx- clear Neck- supple, no JVP Lymph- no cervical lymphadenopathy Lungs- Clear to ausculation bilaterally, normal work of breathing Heart- regular rate and rhythm, 2/6 SEM LUSB early peaking, 3/6 SEM at the apex, PMI not laterally displaced GI- soft, NT, ND, + BS Extremities- no  clubbing, cyanosis, or edema MS- no significant deformity or atrophy Skin- no rash or lesion Psych- euthymic mood, full affect Neuro- strength and sensation are intact                   Assessment and Plan:

## 2011-07-13 NOTE — Assessment & Plan Note (Addendum)
Above goal today She reports better BP control at home 2 gram sodium restriction advised As she is mildly volume overloaded, I will increase lasix to 40mg  daily today. BMET today.

## 2011-07-13 NOTE — Assessment & Plan Note (Signed)
The patient continues to have afib with tachy/brady (as above). As she declines pacemaker and has significant pauses, my options for rate control are limited. Presently, I think the best strategy would be to try to maintain sinus rhythm to avoid RVR or post termination pauses.  Risks, benefits, and alternatives to amiodarone use including liver toxicity, thyroid toxicity, pulmonary toxicity, and occular damage were discussed with the patient and her spouse.  They would like to start amiodarone at this time. I will therefore start amiodarone 200mg  daily.  I will check LFTs and TFTs today. Continue coumadin long term.

## 2011-07-14 ENCOUNTER — Ambulatory Visit (INDEPENDENT_AMBULATORY_CARE_PROVIDER_SITE_OTHER): Payer: Self-pay | Admitting: Cardiovascular Disease

## 2011-07-14 DIAGNOSIS — R0989 Other specified symptoms and signs involving the circulatory and respiratory systems: Secondary | ICD-10-CM

## 2011-07-14 DIAGNOSIS — Z7901 Long term (current) use of anticoagulants: Secondary | ICD-10-CM

## 2011-07-14 DIAGNOSIS — I4891 Unspecified atrial fibrillation: Secondary | ICD-10-CM

## 2011-07-18 ENCOUNTER — Ambulatory Visit (INDEPENDENT_AMBULATORY_CARE_PROVIDER_SITE_OTHER): Payer: Medicare Other | Admitting: *Deleted

## 2011-07-18 DIAGNOSIS — Z7901 Long term (current) use of anticoagulants: Secondary | ICD-10-CM

## 2011-07-18 DIAGNOSIS — I4891 Unspecified atrial fibrillation: Secondary | ICD-10-CM

## 2011-08-01 ENCOUNTER — Ambulatory Visit (INDEPENDENT_AMBULATORY_CARE_PROVIDER_SITE_OTHER): Payer: Medicare Other | Admitting: *Deleted

## 2011-08-01 DIAGNOSIS — Z7901 Long term (current) use of anticoagulants: Secondary | ICD-10-CM

## 2011-08-01 DIAGNOSIS — I4891 Unspecified atrial fibrillation: Secondary | ICD-10-CM

## 2011-08-10 ENCOUNTER — Encounter: Payer: Self-pay | Admitting: Cardiothoracic Surgery

## 2011-08-10 ENCOUNTER — Ambulatory Visit (INDEPENDENT_AMBULATORY_CARE_PROVIDER_SITE_OTHER): Payer: Medicare Other | Admitting: Cardiothoracic Surgery

## 2011-08-10 ENCOUNTER — Ambulatory Visit (INDEPENDENT_AMBULATORY_CARE_PROVIDER_SITE_OTHER): Payer: Medicare Other | Admitting: *Deleted

## 2011-08-10 ENCOUNTER — Ambulatory Visit (INDEPENDENT_AMBULATORY_CARE_PROVIDER_SITE_OTHER): Payer: Medicare Other | Admitting: Internal Medicine

## 2011-08-10 ENCOUNTER — Encounter: Payer: Self-pay | Admitting: Internal Medicine

## 2011-08-10 VITALS — BP 160/82 | HR 64 | Resp 20 | Ht 64.0 in | Wt 107.0 lb

## 2011-08-10 DIAGNOSIS — I495 Sick sinus syndrome: Secondary | ICD-10-CM

## 2011-08-10 DIAGNOSIS — I359 Nonrheumatic aortic valve disorder, unspecified: Secondary | ICD-10-CM

## 2011-08-10 DIAGNOSIS — I062 Rheumatic aortic stenosis with insufficiency: Secondary | ICD-10-CM

## 2011-08-10 DIAGNOSIS — I10 Essential (primary) hypertension: Secondary | ICD-10-CM

## 2011-08-10 DIAGNOSIS — I739 Peripheral vascular disease, unspecified: Secondary | ICD-10-CM

## 2011-08-10 DIAGNOSIS — Z954 Presence of other heart-valve replacement: Secondary | ICD-10-CM

## 2011-08-10 DIAGNOSIS — Z7901 Long term (current) use of anticoagulants: Secondary | ICD-10-CM

## 2011-08-10 DIAGNOSIS — I4891 Unspecified atrial fibrillation: Secondary | ICD-10-CM

## 2011-08-10 NOTE — Assessment & Plan Note (Signed)
Stable No change required today  

## 2011-08-10 NOTE — Progress Notes (Signed)
The patient presents today for cardiology followup.  Since last being seen in our clinic, the patient reports doing well. Her dizziness and palpitations have resolved with amiodarone.  She is tolerating this medicine without problems. Today, she denies symptoms of chest pain, shortness of breath, orthopnea, PND, lower extremity edema,  presyncope, syncope, or neurologic sequela.  The patient feels that she is tolerating medications without difficulties and is otherwise without complaint today.   Past Medical History  Diagnosis Date  . Atrial fibrillation   . PAC (premature atrial contraction)   . PVC (premature ventricular contraction)   . Hypertension   . Tachycardia-bradycardia syndrome   . DVT (deep vein thrombosis) in pregnancy   . Emphysema   . S/P aortic valve replacement     for severe symptomatic aortic stenosis by Dr Tyrone Sage  . S/P mastectomy   . Breast cancer    Past Surgical History  Procedure Date  . Appendectomy   . Mastectomy   . Aortic valve replacement 03/29/2010     #21 pericardial tissue valve [employer] model #3300TFX serical number [id number]    Current Outpatient Prescriptions  Medication Sig Dispense Refill  . amiodarone (PACERONE) 200 MG tablet Take 1 tablet (200 mg total) by mouth daily.  30 tablet  3  . aspirin 81 MG tablet Take 81 mg by mouth daily.        . cilostazol (PLETAL) 100 MG tablet Take 100 mg by mouth 2 (two) times daily.        . Cyanocobalamin (VITAMIN B-12 IJ) Inject as directed every 30 (thirty) days.        . fenofibrate 160 MG tablet Take 160 mg by mouth daily.        . fluticasone (VERAMYST) 27.5 MCG/SPRAY nasal spray Place 2 sprays into the nose daily.        . furosemide (LASIX) 40 MG tablet Take one by mouth daily  90 tablet  3  . Iron Combinations (FE PLUS PROTEIN) 25 MG TABS Take by mouth 2 (two) times daily.        . metoprolol (LOPRESSOR) 50 MG tablet Take 50 mg by mouth 2 (two) times daily.        . Nutritional Supplements  (BOOST PLUS PO) Take by mouth as needed.        . potassium chloride (KLOR-CON) 20 MEQ packet Take 20 mEq by mouth daily.  90 tablet  3  . silver sulfADIAZINE (SILVADENE) 1 % cream Apply topically daily.  400 g  3  . traMADol (ULTRAM) 50 MG tablet Take 50 mg by mouth every 6 (six) hours as needed.        . warfarin (COUMADIN) 2 MG tablet Take by mouth as directed.       . warfarin (COUMADIN) 3 MG tablet Take as directed by Anticoagulation clinic  90 tablet  1    Allergies  Allergen Reactions  . Hydrocodone   . Rosuvastatin     REACTION: Reaction not known    History   Social History  . Marital Status: Married    Spouse Name: N/A    Number of Children: N/A  . Years of Education: N/A   Occupational History  . Not on file.   Social History Main Topics  . Smoking status: Former Games developer  . Smokeless tobacco: Not on file  . Alcohol Use: No  . Drug Use: No  . Sexually Active:    Other Topics Concern  . Not on file  Social History Narrative  . No narrative on file    Family History  Problem Relation Age of Onset  . Breast cancer Mother   . Stroke Sister   . Diabetes Brother   . Heart attack Brother    Physical Exam: Filed Vitals:   08/10/11 1225  BP: 118/80  Pulse: 70  Height: 5\' 3"  (1.6 m)  Weight: 106 lb (48.081 kg)    GEN- The patient is thin appearing, alert and oriented x 3 today.   Head- normocephalic, atraumatic Eyes-  Sclera clear, conjunctiva pink Ears- hearing intact Oropharynx- clear Neck- supple, no JVP Lymph- no cervical lymphadenopathy Lungs- Clear to ausculation bilaterally, normal work of breathing Heart- regular rate and rhythm, 2/6 SEM LUSB early peaking, 3/6 SEM at the apex, PMI not laterally displaced GI- soft, NT, ND, + BS Extremities- no clubbing, cyanosis, or edema MS- no significant deformity or atrophy Skin- no rash or lesion Psych- euthymic mood, full affect Neuro- strength and sensation are intact               ekg today  reveals sinus rhythm 69 bpm, PR 142, QRS 86, Qtc 490, otherwise normal ekg      Assessment and Plan:

## 2011-08-10 NOTE — Progress Notes (Signed)
[address]            [place] [address]          [phone]       TAMECKA MILHAM [facility] Medical Record #[id number] Date of Birth: September 09, 1929  Referring: Gardiner Rhyme, MD Primary Care: Marylen Ponto, MD  Chief Complaint:    Chief Complaint  Patient presents with  . Routine Post Op    3 month f/u, S/P Aortic Valve replacement 03/29/2010, re check necrotic toes    History of Present Illness:     Patient returns today for followup check . It's been a proximally 16 months since her aortic valve replacement. Functionally she seems much improved. The toes of her right forearm almost now completely healed.       Past Medical History  Diagnosis Date  . Atrial fibrillation   . PAC (premature atrial contraction)   . PVC (premature ventricular contraction)   . Hypertension   . Tachycardia-bradycardia syndrome   . DVT (deep vein thrombosis) in pregnancy   . Emphysema   . S/P aortic valve replacement     for severe symptomatic aortic stenosis by Dr Tyrone Sage  . S/P mastectomy   . Breast cancer     Past Surgical History  Procedure Date  . Appendectomy   . Mastectomy   . Aortic valve replacement 03/29/2010     #21 pericardial tissue valve [employer] model #3300TFX serical number [id number]    History  Smoking status  . Former Smoker  Smokeless tobacco  . Not on file   History  Alcohol Use No    History   Social History  . Marital Status: Married    Spouse Name: N/A    Number of Children: N/A  . Years of Education: N/A   Occupational History  . Not on file.   Social History Main Topics  . Smoking status: Former Games developer  . Smokeless tobacco: Not on file  . Alcohol Use: No  . Drug Use: No  . Sexually Active:    Other Topics Concern  . Not on file   Social History Narrative  . No narrative on file    Allergies  Allergen Reactions  . Hydrocodone   . Rosuvastatin     REACTION: Reaction not known     Current Outpatient Prescriptions  Medication Sig Dispense Refill  . amiodarone (PACERONE) 200 MG tablet Take 1 tablet (200 mg total) by mouth daily.  30 tablet  3  . aspirin 81 MG tablet Take 81 mg by mouth daily.        . cilostazol (PLETAL) 100 MG tablet Take 100 mg by mouth 2 (two) times daily.        . Cyanocobalamin (VITAMIN B-12 IJ) Inject as directed every 30 (thirty) days.        . fenofibrate 160 MG tablet Take 160 mg by mouth daily.        . fluticasone (VERAMYST) 27.5 MCG/SPRAY nasal spray Place 2 sprays into the nose daily.        . furosemide (LASIX) 40 MG tablet Take one by mouth daily  90 tablet  3  . Iron Combinations (FE PLUS PROTEIN) 25 MG TABS Take by mouth 2 (two) times daily.        . metoprolol (LOPRESSOR) 50 MG tablet Take 50 mg by mouth 2 (two) times  daily.        . Nutritional Supplements (BOOST PLUS PO) Take by mouth as needed.        . potassium chloride (KLOR-CON) 20 MEQ packet Take 20 mEq by mouth daily.  90 tablet  3  . silver sulfADIAZINE (SILVADENE) 1 % cream Apply topically daily.  400 g  3  . traMADol (ULTRAM) 50 MG tablet Take 50 mg by mouth every 6 (six) hours as needed.        . warfarin (COUMADIN) 2 MG tablet Take by mouth as directed.       . warfarin (COUMADIN) 3 MG tablet Take as directed by Anticoagulation clinic  90 tablet  1     (Not in a hospital admission)  Family History  Problem Relation Age of Onset  . Breast cancer Mother   . Stroke Sister   . Diabetes Brother   . Heart attack Brother      Review of Systems:     Cardiac Review of Systems: Y or N  Chest Pain [   n ]  Resting SOB [n   ] Exertional SOB  [ y ]  Orthopnea [ n ]   Pedal Edema [ n  ]    Palpitations [ y ] Syncope  [ n ]   Presyncope [ n  ]  General Review of Systems: [Y] = yes [  ]=no Constitional: recent weight change [  ]; anorexia [  ]; fatigue [  ]; nausea [  ]; night sweats [  ]; fever [  ]; or chills [  ];                                                                                                                                           Dental: poor dentition[  ];   Eye : blurred vision [  ]; diplopia [   ]; vision changes [  ];  Amaurosis fugax[  ]; Resp: cough [  ];  wheezing[  ];  hemoptysis[  ]; shortness of breath[  ]; paroxysmal nocturnal dyspnea[  ]; dyspnea on exertion[  ]; or orthopnea[  ];  GI:  gallstones[  ], vomiting[  ];  dysphagia[  ]; melena[  ];  hematochezia [  ]; heartburn[  ];   Hx of  Colonoscopy[  ]; GU: kidney stones [  ]; hematuria[  ];   dysuria [  ];  nocturia[  ];  history of     obstruction [  ];             Skin: rash, swelling[  ];, hair loss[  ];  peripheral edema[  ];  or itching[  ]; Musculosketetal: myalgias[  ];  joint swelling[  ];  joint erythema[  ];  joint pain[  ];  back pain[  ];  Heme/Lymph: bruising[  ];  bleeding[  ];  anemia[  ];  Neuro: TIA[  ];  headaches[  ];  stroke[  ];  vertigo[  ];  seizures[  ];   paresthesias[  ];  difficulty walking[  ];  Psych:depression[  ]; anxiety[  ];  Endocrine: diabetes[  ];  thyroid dysfunction[  ];  Immunizations: Flu Cove.Etienne  ]; Pneumococcal[ y ];  Other:  Physical Exam: BP 160/82  Pulse 64  Resp 20  Ht 5\' 4"  (1.626 m)  Wt 107 lb (48.535 kg)  BMI 18.37 kg/m2  SpO2 96%  General appearance: alert, cooperative and no distress Neurologic: intact Heart: regular rate and rhythm, S1, S2 normal, no murmur, click, rub or gallop and normal apical impulse Lungs: clear to auscultation bilaterally Abdomen: soft, non-tender; bowel sounds normal; no masses,  no organomegaly Extremities: The toes on the patient's right foot continued to improve. The second and third toes still have some eschar which is close to falling off there is no evidence of active infection. There is a concern in her left foot and fingers have completely healed Wound: Patient's sternum is stable and well healed   Diagnostic Studies & Laboratory data:     Recent Radiology Findings:   No results  found.    Recent Lab Findings: Lab Results  Component Value Date   WBC 3.9* 06/14/2011   HGB 9.4* 06/14/2011   HCT 28.3* 06/14/2011   PLT 184.0 06/14/2011   GLUCOSE 118* 07/12/2011   ALT 14 07/12/2011   AST 25 07/12/2011   NA 144 07/12/2011   K 3.8 07/12/2011   CL 106 07/12/2011   CREATININE 1.3* 07/12/2011   BUN 19 07/12/2011   CO2 31 07/12/2011   TSH 1.54 07/12/2011   INR 3.3 08/10/2011   HGBA1C  Value: 5.4 (NOTE)                                                                       According to the ADA Clinical Practice Recommendations for 2011, when HbA1c is used as a screening test:   >=6.5%   Diagnostic of Diabetes Mellitus           (if abnormal result  is confirmed)  5.7-6.4%   Increased risk of developing Diabetes Mellitus  References:Diagnosis and Classification of Diabetes Mellitus,Diabetes Care,2011,34(Suppl 1):S62-S69 and Standards of Medical Care in         Diabetes - 2011,Diabetes Care,2011,34  (Suppl 1):S11-S61. 03/24/2010      Assessment / Plan:     We'll continue with local wound care of the feet. The patient has a appointment to see Dr. Johney Frame later today. She was recently started on amiodarone because of episodes of atrial fibrillation. She notes she's had no recurrence that she's aware of since starting the medication. He suggested to her that she possibly would need a pacemaker in the future.       Delight Ovens MD 08/10/2011 1:37 PM

## 2011-08-10 NOTE — Assessment & Plan Note (Signed)
Doing well on amiodarone at this time She and her husband continue to want to avoid PPM No changes today.

## 2011-08-10 NOTE — Assessment & Plan Note (Signed)
Maintaining sinus rhythm with amiodarone Goal INR 2-3

## 2011-08-10 NOTE — Patient Instructions (Signed)
Your physician recommends that you schedule a follow-up appointment in: 2 months  

## 2011-08-22 NOTE — Progress Notes (Signed)
Addended by: Lacie Scotts on: 08/22/2011 04:23 PM   Modules accepted: Orders

## 2011-08-24 ENCOUNTER — Ambulatory Visit (INDEPENDENT_AMBULATORY_CARE_PROVIDER_SITE_OTHER): Payer: Medicare Other | Admitting: *Deleted

## 2011-08-24 DIAGNOSIS — I4891 Unspecified atrial fibrillation: Secondary | ICD-10-CM

## 2011-08-24 DIAGNOSIS — Z7901 Long term (current) use of anticoagulants: Secondary | ICD-10-CM

## 2011-09-01 NOTE — Progress Notes (Signed)
Addended by: Dennis Bast F on: 09/01/2011 08:31 AM   Modules accepted: Orders

## 2011-09-14 ENCOUNTER — Ambulatory Visit (INDEPENDENT_AMBULATORY_CARE_PROVIDER_SITE_OTHER): Payer: Medicare Other | Admitting: *Deleted

## 2011-09-14 DIAGNOSIS — I4891 Unspecified atrial fibrillation: Secondary | ICD-10-CM

## 2011-09-14 DIAGNOSIS — Z7901 Long term (current) use of anticoagulants: Secondary | ICD-10-CM

## 2011-10-18 ENCOUNTER — Ambulatory Visit (INDEPENDENT_AMBULATORY_CARE_PROVIDER_SITE_OTHER): Payer: Medicare Other | Admitting: *Deleted

## 2011-10-18 ENCOUNTER — Ambulatory Visit (INDEPENDENT_AMBULATORY_CARE_PROVIDER_SITE_OTHER): Payer: Medicare Other | Admitting: Internal Medicine

## 2011-10-18 VITALS — BP 130/92 | HR 76 | Resp 19

## 2011-10-18 DIAGNOSIS — I4891 Unspecified atrial fibrillation: Secondary | ICD-10-CM

## 2011-10-18 DIAGNOSIS — Z7901 Long term (current) use of anticoagulants: Secondary | ICD-10-CM

## 2011-10-18 DIAGNOSIS — I495 Sick sinus syndrome: Secondary | ICD-10-CM

## 2011-10-18 LAB — POCT INR: INR: 3.5

## 2011-10-18 LAB — T4, FREE: Free T4: 1.22 ng/dL (ref 0.60–1.60)

## 2011-10-18 MED ORDER — AMIODARONE HCL 200 MG PO TABS: 200.0000 mg | ORAL_TABLET | Freq: Every day | ORAL | Status: DC

## 2011-10-18 MED ORDER — METOPROLOL TARTRATE 50 MG PO TABS: 50.0000 mg | ORAL_TABLET | Freq: Two times a day (BID) | ORAL | Status: DC

## 2011-10-18 NOTE — Assessment & Plan Note (Signed)
Doing well on amiodarone at this time with any further symptoms She and her husband continue to want to avoid PPM No changes today.  Check TFTs and LFTs today

## 2011-10-18 NOTE — Patient Instructions (Signed)
Your physician recommends that you schedule a follow-up appointment in: 3 months with Dr Allred  Your physician recommends that you return for lab work today    

## 2011-10-18 NOTE — Progress Notes (Signed)
The patient presents today for cardiology followup.  Since last being seen in our clinic, the patient reports doing well. Her dizziness and palpitations have resolved with amiodarone.  She feels that she is maintaining sinus rhythm.  Today, she denies symptoms of chest pain, shortness of breath, orthopnea, PND, lower extremity edema,  presyncope, syncope, or neurologic sequela.  The patient feels that she is tolerating medications without difficulties and is otherwise without complaint today.   Past Medical History  Diagnosis Date  . Atrial fibrillation   . PAC (premature atrial contraction)   . PVC (premature ventricular contraction)   . Hypertension   . Tachycardia-bradycardia syndrome   . DVT (deep vein thrombosis) in pregnancy   . Emphysema   . S/P aortic valve replacement     for severe symptomatic aortic stenosis by Dr Tyrone Sage  . S/P mastectomy   . Breast cancer    Past Surgical History  Procedure Date  . Appendectomy   . Mastectomy   . Aortic valve replacement 03/29/2010     #21 pericardial tissue valve [employer] model #3300TFX serical number [id number]    Current Outpatient Prescriptions  Medication Sig Dispense Refill  . amiodarone (PACERONE) 200 MG tablet Take 1 tablet (200 mg total) by mouth daily.  90 tablet  3  . aspirin 81 MG tablet Take 81 mg by mouth daily.        . cilostazol (PLETAL) 100 MG tablet Take 100 mg by mouth 2 (two) times daily.        . Cyanocobalamin (VITAMIN B-12 IJ) Inject as directed every 30 (thirty) days.        . fenofibrate 160 MG tablet Take 160 mg by mouth daily.        . fluticasone (VERAMYST) 27.5 MCG/SPRAY nasal spray Place 2 sprays into the nose daily.        . furosemide (LASIX) 40 MG tablet Take one by mouth daily  90 tablet  3  . Iron Combinations (FE PLUS PROTEIN) 25 MG TABS Take by mouth 2 (two) times daily.        . metoprolol (LOPRESSOR) 50 MG tablet Take 1 tablet (50 mg total) by mouth 2 (two) times daily.  180 tablet  3    . Nutritional Supplements (BOOST PLUS PO) Take by mouth as needed.        . potassium chloride (KLOR-CON) 20 MEQ packet Take 20 mEq by mouth daily.  90 tablet  3  . silver sulfADIAZINE (SILVADENE) 1 % cream Apply topically daily.  400 g  3  . traMADol (ULTRAM) 50 MG tablet Take 50 mg by mouth every 6 (six) hours as needed.        . warfarin (COUMADIN) 2 MG tablet Take by mouth as directed.       . warfarin (COUMADIN) 3 MG tablet Take as directed by Anticoagulation clinic  90 tablet  1    Allergies  Allergen Reactions  . Hydrocodone   . Rosuvastatin     REACTION: Reaction not known    History   Social History  . Marital Status: Married    Spouse Name: N/A    Number of Children: N/A  . Years of Education: N/A   Occupational History  . Not on file.   Social History Main Topics  . Smoking status: Former Games developer  . Smokeless tobacco: Not on file  . Alcohol Use: No  . Drug Use: No  . Sexually Active:    Other Topics Concern  .  Not on file   Social History Narrative  . No narrative on file    Family History  Problem Relation Age of Onset  . Breast cancer Mother   . Stroke Sister   . Diabetes Brother   . Heart attack Brother    Physical Exam: Filed Vitals:   10/18/11 1047  BP: 130/92  Pulse: 76  Resp: 19    GEN- The patient is thin appearing, alert and oriented x 3 today.   Head- normocephalic, atraumatic Eyes-  Sclera clear, conjunctiva pink Ears- hearing intact Oropharynx- clear Neck- supple, no JVP Lymph- no cervical lymphadenopathy Lungs- Clear to ausculation bilaterally, normal work of breathing Heart- regular rate and rhythm, 2/6 SEM LUSB early peaking, 3/6 SEM at the apex, PMI not laterally displaced GI- soft, NT, ND, + BS Extremities- no clubbing, cyanosis, or edema                ekg today reveals sinus rhythm769 bpm, PR 146, QRS 86, Qtc 499, otherwise normal ekg      Assessment and Plan:

## 2011-10-18 NOTE — Assessment & Plan Note (Signed)
Maintaining sinus rhythm with amiodarone Continue coumadin  I would recommend that she stop ASA as I do not think that she receives additional benefit from this.  She would like to discuss this with Dr Tyrone Sage before stopping.

## 2011-10-19 LAB — HEPATIC FUNCTION PANEL: Albumin: 3.3 g/dL — ABNORMAL LOW (ref 3.5–5.2)

## 2011-10-19 LAB — T4: T4, Total: 9.4 ug/dL (ref 5.0–12.5)

## 2011-10-20 LAB — TSH: TSH: 2.79 u[IU]/mL (ref 0.35–5.50)

## 2011-10-24 LAB — AMIODARONE LEVEL

## 2011-11-09 ENCOUNTER — Ambulatory Visit (INDEPENDENT_AMBULATORY_CARE_PROVIDER_SITE_OTHER): Payer: Medicare Other | Admitting: Cardiothoracic Surgery

## 2011-11-09 ENCOUNTER — Encounter: Payer: Self-pay | Admitting: Cardiothoracic Surgery

## 2011-11-09 ENCOUNTER — Ambulatory Visit (INDEPENDENT_AMBULATORY_CARE_PROVIDER_SITE_OTHER): Payer: Medicare Other | Admitting: Pharmacist

## 2011-11-09 VITALS — BP 154/78 | HR 92 | Temp 98.7°F | Resp 20 | Ht 64.0 in | Wt 102.0 lb

## 2011-11-09 DIAGNOSIS — I96 Gangrene, not elsewhere classified: Secondary | ICD-10-CM

## 2011-11-09 DIAGNOSIS — I4891 Unspecified atrial fibrillation: Secondary | ICD-10-CM

## 2011-11-09 DIAGNOSIS — Z09 Encounter for follow-up examination after completed treatment for conditions other than malignant neoplasm: Secondary | ICD-10-CM

## 2011-11-09 DIAGNOSIS — I350 Nonrheumatic aortic (valve) stenosis: Secondary | ICD-10-CM

## 2011-11-09 DIAGNOSIS — Z7901 Long term (current) use of anticoagulants: Secondary | ICD-10-CM

## 2011-11-09 DIAGNOSIS — I359 Nonrheumatic aortic valve disorder, unspecified: Secondary | ICD-10-CM

## 2011-11-09 NOTE — Progress Notes (Signed)
[address]            [place] [address]          [phone]         LIBRA GATZ [facility] Medical Record #[id number] Date of Birth: Jun 14, 1929  Referring: Gardiner Rhyme, MD Primary Care: Marylen Ponto, MD, MD  Chief Complaint:    Chief Complaint  Patient presents with  . Routine Post Op    3 month f/u, AVR 03/29/10, recheck necrotic toes    History of Present Illness:     Patient returns today for followup check . It's been a proximally 20 months since her aortic valve replacement. Functionally she seems much improved. The toes of her right forearm almost now completely healed. She has had some symptoms of sinusitis, and nasal congestion. She has appointment to see her primary care doctor later this afternoon.      Past Medical History  Diagnosis Date  . Atrial fibrillation   . PAC (premature atrial contraction)   . PVC (premature ventricular contraction)   . Hypertension   . Tachycardia-bradycardia syndrome   . DVT (deep vein thrombosis) in pregnancy   . Emphysema   . S/P aortic valve replacement     for severe symptomatic aortic stenosis by Dr Tyrone Sage  . S/P mastectomy   . Breast cancer     Past Surgical History  Procedure Date  . Appendectomy   . Mastectomy   . Aortic valve replacement 03/29/2010     #21 pericardial tissue valve [employer] model #3300TFX serical number [id number]    History  Smoking status  . Former Smoker  Smokeless tobacco  . Not on file   History  Alcohol Use No    History   Social History  . Marital Status: Married    Spouse Name: N/A    Number of Children: N/A  . Years of Education: N/A   Occupational History  . Not on file.   Social History Main Topics  . Smoking status: Former Games developer  . Smokeless tobacco: Not on file  . Alcohol Use: No  . Drug Use: No  . Sexually Active:    Other Topics Concern  . Not on file   Social History Narrative  . No  narrative on file    Allergies  Allergen Reactions  . Hydrocodone   . Rosuvastatin     REACTION: Reaction not known    Current Outpatient Prescriptions  Medication Sig Dispense Refill  . amiodarone (PACERONE) 200 MG tablet Take 1 tablet (200 mg total) by mouth daily.  90 tablet  3  . aspirin 81 MG tablet Take 81 mg by mouth daily.        . cilostazol (PLETAL) 100 MG tablet Take 100 mg by mouth 2 (two) times daily.        . Cyanocobalamin (VITAMIN B-12 IJ) Inject as directed every 30 (thirty) days.        . fenofibrate 160 MG tablet Take 160 mg by mouth daily.        . fluticasone (VERAMYST) 27.5 MCG/SPRAY nasal spray Place 2 sprays into the nose daily.        . furosemide (LASIX) 40 MG tablet Take one by mouth daily  90 tablet  3  .  Iron Combinations (FE PLUS PROTEIN) 25 MG TABS Take by mouth 2 (two) times daily.        . metoprolol (LOPRESSOR) 50 MG tablet Take 1 tablet (50 mg total) by mouth 2 (two) times daily.  180 tablet  3  . Nutritional Supplements (BOOST PLUS PO) Take by mouth as needed.        . potassium chloride (KLOR-CON) 20 MEQ packet Take 20 mEq by mouth daily.  90 tablet  3  . silver sulfADIAZINE (SILVADENE) 1 % cream Apply topically daily.  400 g  3  . traMADol (ULTRAM) 50 MG tablet Take 50 mg by mouth every 6 (six) hours as needed.        . warfarin (COUMADIN) 2 MG tablet Take by mouth as directed.       . warfarin (COUMADIN) 3 MG tablet Take as directed by Anticoagulation clinic  90 tablet  1     (Not in a hospital admission)  Family History  Problem Relation Age of Onset  . Breast cancer Mother   . Stroke Sister   . Diabetes Brother   . Heart attack Brother      Review of Systems:     Cardiac Review of Systems: Y or N  Chest Pain [   n ]  Resting SOB [n   ] Exertional SOB  [ y ]  Orthopnea [ n ]   Pedal Edema [ n  ]    Palpitations [ y ] Syncope  [ n ]   Presyncope [ n  ]  General Review of Systems: [Y] = yes [  ]=no Constitional: recent weight change  [  ]; anorexia [  ]; fatigue [  ]; nausea [  ]; night sweats [  ]; fever [  ]; or chills [  ];                                                                                                                                          Dental: poor dentition[  ];   Eye : blurred vision [  ]; diplopia [   ]; vision changes [  ];  Amaurosis fugax[  ]; Resp: cough [  ];  wheezing[  ];  hemoptysis[  ]; shortness of breath[  ]; paroxysmal nocturnal dyspnea[  ]; dyspnea on exertion[  ]; or orthopnea[  ];  GI:  gallstones[  ], vomiting[  ];  dysphagia[  ]; melena[  ];  hematochezia [  ]; heartburn[  ];   Hx of  Colonoscopy[  ]; GU: kidney stones [  ]; hematuria[  ];   dysuria [  ];  nocturia[  ];  history of     obstruction [  ];             Skin: rash, swelling[  ];, hair loss[  ];  peripheral edema[  ];  or itching[  ]; Musculosketetal: myalgias[  ];  joint swelling[  ];  joint erythema[  ];  joint pain[  ];  back pain[  ];  Heme/Lymph: bruising[  ];  bleeding[  ];  anemia[  ];  Neuro: TIA[  ];  headaches[  ];  stroke[  ];  vertigo[  ];  seizures[  ];   paresthesias[  ];  difficulty walking[  ];  Psych:depression[  ]; anxiety[  ];  Endocrine: diabetes[  ];  thyroid dysfunction[  ];  Immunizations: Flu Cove.Etienne  ]; Pneumococcal[ y ];  Other:  Physical Exam: BP 154/78  Pulse 92  Temp(Src) 98.7 F (37.1 C) (Oral)  Resp 20  Ht 5\' 4"  (1.626 m)  Wt 102 lb (46.267 kg)  BMI 17.51 kg/m2  SpO2 90%  General appearance: alert, cooperative and no distress Neurologic: intact Heart: regular rate and rhythm, S1, S2 normal, no murmur, click, rub or gallop and normal apical impulse Lungs: clear to auscultation bilaterally Abdomen: soft, non-tender; bowel sounds normal; no masses,  no organomegaly Extremities: The toes on the patient's right foot continued to improve. The  third toe still have some eschar which is close to falling off there is no evidence of active infection the remaining toes have completely heal.  There is a concern in her left foot and fingers have completely healed Wound: Patient's sternum is stable and well healed   Diagnostic Studies & Laboratory data:          Recent Lab Findings: Lab Results  Component Value Date   WBC 3.9* 06/14/2011   HGB 9.4* 06/14/2011   HCT 28.3* 06/14/2011   PLT 184.0 06/14/2011   GLUCOSE 118* 07/12/2011   ALT 28 10/18/2011   AST 51* 10/18/2011   NA 144 07/12/2011   K 3.8 07/12/2011   CL 106 07/12/2011   CREATININE 1.3* 07/12/2011   BUN 19 07/12/2011   CO2 31 07/12/2011   TSH 2.79 10/18/2011   INR 3.1 11/09/2011   HGBA1C  Value: 5.4 (NOTE)                                                                       According to the ADA Clinical Practice Recommendations for 2011, when HbA1c is used as a screening test:   >=6.5%   Diagnostic of Diabetes Mellitus           (if abnormal result  is confirmed)  5.7-6.4%   Increased risk of developing Diabetes Mellitus  References:Diagnosis and Classification of Diabetes Mellitus,Diabetes Care,2011,34(Suppl 1):S62-S69 and Standards of Medical Care in         Diabetes - 2011,Diabetes Care,2011,34  (Suppl 1):S11-S61. 03/24/2010      Assessment / Plan:     We'll continue with local wound care of the feet.  She was recently started on amiodarone because of episodes of atrial fibrillation.  Dr. Johney Frame raised the question of continuation of low-dose aspirin in light of her tissue valve and been on Coumadin. Since she is on Coumadin she does not need aspirin for her tissue valve, however in suggesting this she and her husband prefer to keep taking it. I told him if she has any difficulty tolerating the aspirin that he could be stopped without  significant sequelae.  Plan to see her back in 3 months.   Delight Ovens MD 11/09/2011 12:24 PM

## 2011-11-23 ENCOUNTER — Ambulatory Visit (INDEPENDENT_AMBULATORY_CARE_PROVIDER_SITE_OTHER): Payer: Medicare Other

## 2011-11-23 DIAGNOSIS — I4891 Unspecified atrial fibrillation: Secondary | ICD-10-CM

## 2011-11-23 DIAGNOSIS — Z7901 Long term (current) use of anticoagulants: Secondary | ICD-10-CM

## 2011-11-23 LAB — POCT INR: INR: 2.6

## 2011-12-14 ENCOUNTER — Ambulatory Visit (INDEPENDENT_AMBULATORY_CARE_PROVIDER_SITE_OTHER): Payer: Medicare Other | Admitting: *Deleted

## 2011-12-14 DIAGNOSIS — I4891 Unspecified atrial fibrillation: Secondary | ICD-10-CM

## 2011-12-14 DIAGNOSIS — Z7901 Long term (current) use of anticoagulants: Secondary | ICD-10-CM

## 2011-12-14 LAB — POCT INR: INR: 2.2

## 2012-01-11 ENCOUNTER — Ambulatory Visit (INDEPENDENT_AMBULATORY_CARE_PROVIDER_SITE_OTHER): Payer: Medicare Other | Admitting: *Deleted

## 2012-01-11 DIAGNOSIS — I4891 Unspecified atrial fibrillation: Secondary | ICD-10-CM

## 2012-01-11 DIAGNOSIS — Z7901 Long term (current) use of anticoagulants: Secondary | ICD-10-CM

## 2012-01-24 ENCOUNTER — Encounter: Payer: Self-pay | Admitting: Internal Medicine

## 2012-01-24 ENCOUNTER — Ambulatory Visit (INDEPENDENT_AMBULATORY_CARE_PROVIDER_SITE_OTHER): Payer: Medicare Other | Admitting: Internal Medicine

## 2012-01-24 VITALS — BP 124/68 | HR 72 | Ht 64.0 in | Wt 103.0 lb

## 2012-01-24 DIAGNOSIS — I350 Nonrheumatic aortic (valve) stenosis: Secondary | ICD-10-CM

## 2012-01-24 DIAGNOSIS — I4891 Unspecified atrial fibrillation: Secondary | ICD-10-CM

## 2012-01-24 DIAGNOSIS — I359 Nonrheumatic aortic valve disorder, unspecified: Secondary | ICD-10-CM

## 2012-01-24 NOTE — Assessment & Plan Note (Signed)
We will repeat an echo to evaluate her valvular heart disease

## 2012-01-24 NOTE — Assessment & Plan Note (Signed)
Maintaining sinus rhythm with amiodarone  LFTs/TFTs upon return Continue coumadin long term

## 2012-01-24 NOTE — Patient Instructions (Signed)
Your physician wants you to follow-up in: 6 months with Dr Allred You will receive a reminder letter in the mail two months in advance. If you don't receive a letter, please call our office to schedule the follow-up appointment.   Your physician has requested that you have an echocardiogram. Echocardiography is a painless test that uses sound waves to create images of your heart. It provides your doctor with information about the size and shape of your heart and how well your heart's chambers and valves are working. This procedure takes approximately one hour. There are no restrictions for this procedure.    

## 2012-01-24 NOTE — Assessment & Plan Note (Signed)
Stable No changes 

## 2012-01-24 NOTE — Progress Notes (Signed)
PCP: Marylen Ponto, MD, MD  The patient presents today for cardiology followup.  Since last being seen in our clinic, the patient reports doing well. Her dizziness and palpitations have resolved with amiodarone.  She has no new concerns today.  Today, she denies symptoms of chest pain, shortness of breath, orthopnea, PND, lower extremity edema,  presyncope, syncope, or neurologic sequela.  The patient feels that she is tolerating medications without difficulties and is otherwise without complaint today.   Past Medical History  Diagnosis Date  . Atrial fibrillation   . PAC (premature atrial contraction)   . PVC (premature ventricular contraction)   . Hypertension   . Tachycardia-bradycardia syndrome   . DVT (deep vein thrombosis) in pregnancy   . Emphysema   . S/P aortic valve replacement     for severe symptomatic aortic stenosis by Dr Tyrone Sage  . S/P mastectomy   . Breast cancer    Past Surgical History  Procedure Date  . Appendectomy   . Mastectomy   . Aortic valve replacement 03/29/2010     #21 pericardial tissue valve [employer] model #3300TFX serical number [id number]    Current Outpatient Prescriptions  Medication Sig Dispense Refill  . amiodarone (PACERONE) 200 MG tablet Take 1 tablet (200 mg total) by mouth daily.  90 tablet  3  . aspirin 81 MG tablet Take 81 mg by mouth daily.        . cetirizine (ZYRTEC) 10 MG tablet Take 10 mg by mouth daily.      . cilostazol (PLETAL) 100 MG tablet Take 100 mg by mouth 2 (two) times daily.        . Cyanocobalamin (VITAMIN B-12 IJ) Inject as directed every 30 (thirty) days.        . fenofibrate 160 MG tablet Take 160 mg by mouth daily.        . fluticasone (VERAMYST) 27.5 MCG/SPRAY nasal spray Place 2 sprays into the nose daily.        . furosemide (LASIX) 40 MG tablet Take one by mouth daily  90 tablet  3  . Iron Combinations (FE PLUS PROTEIN) 25 MG TABS Take by mouth 2 (two) times daily.        . metoprolol (LOPRESSOR) 50 MG  tablet Take 1 tablet (50 mg total) by mouth 2 (two) times daily.  180 tablet  3  . Nutritional Supplements (BOOST PLUS PO) Take by mouth as needed.        . potassium chloride (KLOR-CON) 20 MEQ packet Take 20 mEq by mouth daily.  90 tablet  3  . silver sulfADIAZINE (SILVADENE) 1 % cream Apply topically daily.  400 g  3  . traMADol (ULTRAM) 50 MG tablet Take 50 mg by mouth every 6 (six) hours as needed.        . warfarin (COUMADIN) 2 MG tablet Take by mouth as directed.       . warfarin (COUMADIN) 3 MG tablet Take as directed by Anticoagulation clinic  90 tablet  1    Allergies  Allergen Reactions  . Hydrocodone   . Rosuvastatin     REACTION: Reaction not known    History   Social History  . Marital Status: Married    Spouse Name: N/A    Number of Children: N/A  . Years of Education: N/A   Occupational History  . Not on file.   Social History Main Topics  . Smoking status: Former Games developer  . Smokeless tobacco: Not on file  .  Alcohol Use: No  . Drug Use: No  . Sexually Active:    Other Topics Concern  . Not on file   Social History Narrative  . No narrative on file    Family History  Problem Relation Age of Onset  . Breast cancer Mother   . Stroke Sister   . Diabetes Brother   . Heart attack Brother    Physical Exam: Filed Vitals:   01/24/12 1112  BP: 124/68  Pulse: 72  Height: 5\' 4"  (1.626 m)  Weight: 103 lb (46.72 kg)    GEN- The patient is thin appearing, alert and oriented x 3 today.   Head- normocephalic, atraumatic Eyes-  Sclera clear, conjunctiva pink Ears- hearing intact Oropharynx- clear Neck- supple, no JVP Lymph- no cervical lymphadenopathy Lungs- Clear to ausculation bilaterally, normal work of breathing Heart- regular rate and rhythm, 2/6 SEM LUSB early peaking, 3/6 SEM at the apex, PMI not laterally displaced GI- soft, NT, ND, + BS Extremities- no clubbing, cyanosis, or edema                ekg today reveals sinus rhythm72 bpm, PR 150,  QRS 84, Qtcc 508, otherwise normal ekg      Assessment and Plan:

## 2012-01-31 ENCOUNTER — Ambulatory Visit (HOSPITAL_COMMUNITY): Payer: Medicare Other | Attending: Cardiovascular Disease

## 2012-01-31 ENCOUNTER — Other Ambulatory Visit: Payer: Self-pay

## 2012-01-31 DIAGNOSIS — I080 Rheumatic disorders of both mitral and aortic valves: Secondary | ICD-10-CM | POA: Insufficient documentation

## 2012-01-31 DIAGNOSIS — I4891 Unspecified atrial fibrillation: Secondary | ICD-10-CM

## 2012-01-31 DIAGNOSIS — I350 Nonrheumatic aortic (valve) stenosis: Secondary | ICD-10-CM

## 2012-01-31 DIAGNOSIS — R42 Dizziness and giddiness: Secondary | ICD-10-CM | POA: Insufficient documentation

## 2012-01-31 DIAGNOSIS — I10 Essential (primary) hypertension: Secondary | ICD-10-CM | POA: Insufficient documentation

## 2012-01-31 DIAGNOSIS — I359 Nonrheumatic aortic valve disorder, unspecified: Secondary | ICD-10-CM

## 2012-01-31 DIAGNOSIS — I079 Rheumatic tricuspid valve disease, unspecified: Secondary | ICD-10-CM | POA: Insufficient documentation

## 2012-02-08 ENCOUNTER — Other Ambulatory Visit: Payer: Self-pay | Admitting: Cardiothoracic Surgery

## 2012-02-08 ENCOUNTER — Ambulatory Visit
Admission: RE | Admit: 2012-02-08 | Discharge: 2012-02-08 | Disposition: A | Discharge status: Home / Self Care | Payer: Medicare Other | Source: Ambulatory Visit | Attending: Cardiothoracic Surgery | Admitting: Cardiothoracic Surgery

## 2012-02-08 ENCOUNTER — Ambulatory Visit (INDEPENDENT_AMBULATORY_CARE_PROVIDER_SITE_OTHER): Payer: Medicare Other | Admitting: Cardiothoracic Surgery

## 2012-02-08 ENCOUNTER — Encounter: Payer: Self-pay | Admitting: Cardiothoracic Surgery

## 2012-02-08 VITALS — BP 153/72 | HR 75 | Resp 16 | Ht 64.0 in | Wt 103.0 lb

## 2012-02-08 DIAGNOSIS — Z952 Presence of prosthetic heart valve: Secondary | ICD-10-CM

## 2012-02-08 DIAGNOSIS — I96 Gangrene, not elsewhere classified: Secondary | ICD-10-CM

## 2012-02-08 DIAGNOSIS — I350 Nonrheumatic aortic (valve) stenosis: Secondary | ICD-10-CM

## 2012-02-08 DIAGNOSIS — I359 Nonrheumatic aortic valve disorder, unspecified: Secondary | ICD-10-CM

## 2012-02-08 DIAGNOSIS — Z954 Presence of other heart-valve replacement: Secondary | ICD-10-CM

## 2012-02-08 NOTE — Progress Notes (Signed)
[address]            [place] [address]          [phone]          Savannah Roberts [facility] Medical Record #[id number] Date of Birth: [birth year]  Referring: Hillis Range, MD Primary Care: Marylen Ponto, MD, MD  Chief Complaint:    Chief Complaint  Patient presents with  . Follow-up    3 month f/u, s/p AVR on 03/29/2010, recheck wounds on toes      History of Present Illness:     Patient returns today for followup check . It's been a proximally 23 months since her aortic valve replacement. Functionally she seems much improved. The toes of her left foot have now completely healed. One toe on left foot has small escar She remains active at home. Without syncope or chest    Past Medical History  Diagnosis Date  . Atrial fibrillation   . PAC (premature atrial contraction)   . PVC (premature ventricular contraction)   . Hypertension   . Tachycardia-bradycardia syndrome   . DVT (deep vein thrombosis) in pregnancy   . Emphysema   . S/P aortic valve replacement     for severe symptomatic aortic stenosis by Dr Tyrone Sage  . S/P mastectomy   . Breast cancer     Past Surgical History  Procedure Date  . Appendectomy   . Mastectomy   . Aortic valve replacement 03/29/2010     #21 pericardial tissue valve [employer] model #3300TFX serical number [id number]    History  Smoking status  . Former Smoker  Smokeless tobacco  . Not on file   History  Alcohol Use No    History   Social History  . Marital Status: Married    Spouse Name: N/A    Number of Children: N/A  . Years of Education: N/A   Occupational History  . Not on file.   Social History Main Topics  . Smoking status: Former Games developer  . Smokeless tobacco: Not on file  . Alcohol Use: No  . Drug Use: No  . Sexually Active:    Other Topics Concern  . Not on file   Social History Narrative  . No narrative on file     Allergies  Allergen Reactions  . Hydrocodone   . Rosuvastatin     REACTION: Reaction not known    Current Outpatient Prescriptions  Medication Sig Dispense Refill  . amiodarone (PACERONE) 200 MG tablet Take 1 tablet (200 mg total) by mouth daily.  90 tablet  3  . aspirin 81 MG tablet Take 81 mg by mouth daily.        . cetirizine (ZYRTEC) 10 MG tablet Take 10 mg by mouth daily.      . cilostazol (PLETAL) 100 MG tablet Take 100 mg by mouth 2 (two) times daily.        . Cyanocobalamin (VITAMIN B-12 IJ) Inject as directed every 30 (thirty) days.        . fenofibrate 160 MG tablet Take 160 mg by mouth daily.        . fluticasone (VERAMYST) 27.5 MCG/SPRAY nasal spray Place 2  sprays into the nose daily.        . furosemide (LASIX) 40 MG tablet Take one by mouth daily  90 tablet  3  . Iron Combinations (FE PLUS PROTEIN) 25 MG TABS Take by mouth 2 (two) times daily.        . metoprolol (LOPRESSOR) 50 MG tablet Take 1 tablet (50 mg total) by mouth 2 (two) times daily.  180 tablet  3  . Nutritional Supplements (BOOST PLUS PO) Take by mouth as needed.        . potassium chloride (KLOR-CON) 20 MEQ packet Take 20 mEq by mouth daily.  90 tablet  3  . silver sulfADIAZINE (SILVADENE) 1 % cream Apply topically daily.  400 g  3  . traMADol (ULTRAM) 50 MG tablet Take 50 mg by mouth every 6 (six) hours as needed.        . warfarin (COUMADIN) 2 MG tablet Take by mouth as directed.       . warfarin (COUMADIN) 3 MG tablet Take as directed by Anticoagulation clinic  90 tablet  1     (Not in a hospital admission)  Family History  Problem Relation Age of Onset  . Breast cancer Mother   . Stroke Sister   . Diabetes Brother   . Heart attack Brother      Review of Systems:     Cardiac Review of Systems: Y or N  Chest Pain [   n ]  Resting SOB [n   ] Exertional SOB  [ y ]  Orthopnea [ n ]   Pedal Edema [ n  ]    Palpitations [ y ] Syncope  [ n ]   Presyncope [ n  ]  General Review of Systems: [Y]  = yes [  ]=no Constitional: recent weight change [  ]; anorexia [  ]; fatigue [  ]; nausea [  ]; night sweats [  ]; fever [  ]; or chills [  ];                                                                                                                                          Dental: poor dentition[  ];   Eye : blurred vision [  ]; diplopia [   ]; vision changes [  ];  Amaurosis fugax[  ]; Resp: cough [  ];  wheezing[  ];  hemoptysis[  ]; shortness of breath[  ]; paroxysmal nocturnal dyspnea[  ]; dyspnea on exertion[  ]; or orthopnea[  ];  GI:  gallstones[  ], vomiting[  ];  dysphagia[  ]; melena[  ];  hematochezia [  ]; heartburn[  ];   Hx of  Colonoscopy[  ]; GU: kidney stones [  ]; hematuria[  ];   dysuria [  ];  nocturia[  ];  history of     obstruction [  ];  Skin: rash, swelling[  ];, hair loss[  ];  peripheral edema[  ];  or itching[  ]; Musculosketetal: myalgias[  ];  joint swelling[  ];  joint erythema[  ];  joint pain[  ];  back pain[  ];  Heme/Lymph: bruising[  ];  bleeding[  ];  anemia[  ];  Neuro: TIA[  ];  headaches[  ];  stroke[  ];  vertigo[  ];  seizures[  ];   paresthesias[  ];  difficulty walking[  ];  Psych:depression[  ]; anxiety[  ];  Endocrine: diabetes[  ];  thyroid dysfunction[  ];  Immunizations: Flu [place].Etienne  ]; Pneumococcal[ y ];  Other:  Physical Exam: BP 153/72  Pulse 75  Resp 16  Ht 5\' 4"  (1.626 m)  Wt 103 lb (46.72 kg)  BMI 17.68 kg/m2  SpO2 95%  General appearance: alert, cooperative and no distress Neurologic: intact Heart: regular rate and rhythm, S1, S2 normal, no murmur, click, rub or gallop and normal apical impulse Lungs: clear to auscultation bilaterally Abdomen: soft, non-tender; bowel sounds normal; no masses,  no organomegaly Extremities: The toes on the patient's right foot continued to improve. The  third toe still have some eschar which is close to falling off there is no evidence of active infection the remaining toes have  completely heal. Her left foot and fingers have completely healed Wound: Patient's sternum is stable and well healed   Diagnostic Studies & Laboratory data:      Recent ECHO:  Transthoracic Echocardiography  Patient: Savannah, Roberts MR #: [id number] Study Date: 01/31/2012 Gender: F Age: 78 Height: 162.6cm Weight: 46.7kg BSA: 1.29m^2 Pt. Status: Room:  ATTENDING Nahser, Artelia Laroche, MD REFERRING Hillis Range, MD SONOGRAPHER Aida Raider, RDCS PERFORMING [facility], Site [facility] cc:  ------------------------------------------------------------ LV EF: 60% - 65%  ------------------------------------------------------------ Indications: 424.1 Aortic valve disorders.  ------------------------------------------------------------ History: PMH: Acquired from the patient and from the patient's chart. PMH: Tachy-Brady Syndrome. Peripheral Vascular Disease. Atrial Fibrillation. Carotid Bruit. Dizziness. PAC. PVC. Risk factors: Former tobacco use. Hypertension.  ------------------------------------------------------------ Study Conclusions  - Left ventricle: The cavity size was normal. Wall thickness was increased in a pattern of mild LVH. Systolic function was normal. The estimated ejection fraction was in the range of 60% to 65%. Wall motion was normal; there were no regional wall motion abnormalities. Features are consistent with a pseudonormal left ventricular filling pattern, with concomitant abnormal relaxation and increased filling pressure (grade 2 diastolic dysfunction). E/medial e' > 15 suggests increased LV end diastolic pressure. - Aortic valve: Bioprosthetic aortic valve. No significant prosthetic valve stenosis. Mild eccentric jet of aortic insufficiency. Mean gradient: 13mm Hg (S). Peak gradient: 24mm Hg (S). - Mitral valve: Mildly to moderately calcified, mildly fibrotic annulus. Mild to moderate regurgitation. - Left atrium: The atrium was  moderately dilated. - Right ventricle: The cavity size was normal. Systolic function was normal. - Right atrium: The atrium was mildly dilated. - Tricuspid valve: Mild-moderate regurgitation. Peak RV-RA gradient: 36mm Hg (S). - Pulmonary arteries: PA systolic pressure 42-46 mmHg. - Systemic veins: IVC measured 1.8 cm with normal respirophasic variation, suggesting RA pressure 6-10 mmHg. Impressions:  - Normal LV size with mild LV hypertrophy, EF 60-65%. Moderate diastolic dysfunction. There is evidence for elevated LV filling pressure. Bioprosthetic aortic valve appears to be functioning normally, there is mild eccentric aortic regurgitation. Mild to moderate mitral regurgitation. Mild pulmonary hypertension.  ------------------------------------------------------------ Labs, prior tests, procedures, and surgery: Status post Left Mastectomy. Status post Aortic  Valve Replacment- March 29, 2010 with #21 Pericardial Tissue [facility]. Transthoracic echocardiography. M-mode, complete 2D, spectral Doppler, and color Doppler. Height: Height: 162.6cm. Height: 64in. Weight: Weight: 46.7kg. Weight: 102.8lb. Body mass index: BMI: 17.7kg/m^2. Body surface area: BSA: 1.29m^2. Blood pressure: 159/87. Patient status: Outpatient. Location: [place] [facility]  ------------------------------------------------------------  ------------------------------------------------------------ Left ventricle: The cavity size was normal. Wall thickness was increased in a pattern of mild LVH. Systolic function was normal. The estimated ejection fraction was in the range of 60% to 65%. Wall motion was normal; there were no regional wall motion abnormalities. Features are consistent with a pseudonormal left ventricular filling pattern, with concomitant abnormal relaxation and increased filling pressure (grade 2 diastolic dysfunction). E/medial e' > 15 suggests increased LV end diastolic  pressure.  ------------------------------------------------------------ Aortic valve: Bioprosthetic aortic valve. No significant prosthetic valve stenosis. Mild eccentric jet of aortic insufficiency. Doppler: VTI ratio of LVOT to aortic valve: 0.47. Peak velocity ratio of LVOT to aortic valve: 0.42. Mean gradient: 13mm Hg (S). Peak gradient: 24mm Hg (S).  ------------------------------------------------------------ Aorta: Aortic root: The aortic root was normal in size. Ascending aorta: The ascending aorta was normal in size.  ------------------------------------------------------------ Mitral valve: Mildly to moderately calcified, mildly fibrotic annulus. Doppler: There was no evidence for stenosis. Mild to moderate regurgitation. Peak gradient: 5mm Hg (D).  ------------------------------------------------------------ Left atrium: The atrium was moderately dilated.  ------------------------------------------------------------ Right ventricle: The cavity size was normal. Systolic function was normal.  ------------------------------------------------------------ Pulmonic valve: Structurally normal valve. Cusp separation was normal. Doppler: Transvalvular velocity was within the normal range. No regurgitation.  ------------------------------------------------------------ Tricuspid valve: Doppler: Mild-moderate regurgitation.  ------------------------------------------------------------ Pulmonary artery: PA systolic pressure 42-46 mmHg.  ------------------------------------------------------------ Right atrium: The atrium was mildly dilated.  ------------------------------------------------------------ Pericardium: There was no pericardial effusion.  ------------------------------------------------------------ Systemic veins: IVC measured 1.8 cm with normal respirophasic variation, suggesting RA pressure 6-10  mmHg.  ------------------------------------------------------------  2D measurements Normal Doppler Normal Left ventricle measurements LVID ED, 35.1 mm 43-52 Left ventricle chord, Ea, lat 7.11 cm/ ------- PLAX ann, tiss s LVID ES, 26 mm 23-38 DP chord, E/Ea, lat 15.75 ------- PLAX ann, tiss FS, chord, 26 % >29 DP PLAX Ea, med 5.09 cm/ ------- LVPW, ED 11.5 mm ------ ann, tiss s Ventricular septum DP IVS, ED 18.7 mm ------ E/Ea, med 22 ------- Aorta ann, tiss Root diam, 29 mm ------ DP ED LVOT Left atrium Peak vel, S 102 cm/ ------- AP dim 51 mm ------ s AP dim 3.45 cm/m^2 <2.2 VTI, S 21.9 cm ------- index Aortic valve Peak vel, S 244 cm/ ------- s Mean vel, S 171 cm/ ------- s VTI, S 46.8 cm ------- Mean 13 mm ------- gradient, S Hg Peak 24 mm ------- gradient, S Hg VTI ratio 0.47 ------- LVOT/AV Peak vel 0.42 ------- ratio, LVOT/AV Regurg PHT 489 ms ------- Mitral valve Peak E vel 112 cm/ ------- s Peak A vel 79 cm/ ------- s Deceleratio 243 ms 150-230 n time Peak 5 mm ------- gradient, D Hg Peak E/A 1.4 ------- ratio Max regurg 589 cm/ ------- vel s Regurg VTI 191 cm ------- Tricuspid valve Regurg peak 298 cm/ ------- vel s Peak RV-RA 36 mm ------- gradient, S Hg Max regurg 298 cm/ ------- vel s Right ventricle Sa vel, lat 11.8 cm/ ------- ann, tiss s DP  ------------------------------------------------------------ Prepared and Electronically Authenticated by  Marca Ancona, MD 2013-05-22T14:[id number]:32.300   Recent Lab Findings: Lab Results  Component Value Date   WBC 3.9* 06/14/2011   HGB 9.4* 06/14/2011  HCT 28.3* 06/14/2011   PLT 184.0 06/14/2011   GLUCOSE 118* 07/12/2011   ALT 28 10/18/2011   AST 51* 10/18/2011   NA 144 07/12/2011   K 3.8 07/12/2011   CL 106 07/12/2011   CREATININE 1.3* 07/12/2011   BUN 19 07/12/2011   CO2 31 07/12/2011   TSH 2.79 10/18/2011   INR 2.1 01/11/2012   HGBA1C  Value: 5.4 (NOTE)                                                                        According to the ADA Clinical Practice Recommendations for 2011, when HbA1c is used as a screening test:   >=6.5%   Diagnostic of Diabetes Mellitus           (if abnormal result  is confirmed)  5.7-6.4%   Increased risk of developing Diabetes Mellitus  References:Diagnosis and Classification of Diabetes Mellitus,Diabetes Care,2011,34(Suppl 1):S62-S69 and Standards of Medical Care in         Diabetes - 2011,Diabetes Care,2011,34  (Suppl 1):S11-S61. 03/24/2010      Assessment / Plan:   Stable aortic valve replacement with good function confirmed on echocardiogram. She now almost 2 years following surgery has completely healed all of her toes and fingers with the exception of the right third toe. Overall she is pleased with her progress and notes that she had not had surgery she is sure she would would have died by now is.    Plan to see her back in 4 months.   Delight Ovens MD 02/08/2012 11:25 AM

## 2012-02-22 ENCOUNTER — Ambulatory Visit (INDEPENDENT_AMBULATORY_CARE_PROVIDER_SITE_OTHER): Payer: Medicare Other | Admitting: *Deleted

## 2012-02-22 DIAGNOSIS — Z7901 Long term (current) use of anticoagulants: Secondary | ICD-10-CM

## 2012-02-22 DIAGNOSIS — I4891 Unspecified atrial fibrillation: Secondary | ICD-10-CM

## 2012-04-04 ENCOUNTER — Ambulatory Visit (INDEPENDENT_AMBULATORY_CARE_PROVIDER_SITE_OTHER): Payer: Medicare Other | Admitting: *Deleted

## 2012-04-04 DIAGNOSIS — Z7901 Long term (current) use of anticoagulants: Secondary | ICD-10-CM

## 2012-04-04 DIAGNOSIS — I4891 Unspecified atrial fibrillation: Secondary | ICD-10-CM

## 2012-04-04 LAB — POCT INR: INR: 2.6

## 2012-05-16 ENCOUNTER — Ambulatory Visit (INDEPENDENT_AMBULATORY_CARE_PROVIDER_SITE_OTHER): Payer: Medicare Other | Admitting: Pharmacist

## 2012-05-16 DIAGNOSIS — I4891 Unspecified atrial fibrillation: Secondary | ICD-10-CM

## 2012-05-16 DIAGNOSIS — Z7901 Long term (current) use of anticoagulants: Secondary | ICD-10-CM

## 2012-06-06 ENCOUNTER — Ambulatory Visit (INDEPENDENT_AMBULATORY_CARE_PROVIDER_SITE_OTHER): Payer: Medicare Other | Admitting: Cardiothoracic Surgery

## 2012-06-06 ENCOUNTER — Encounter: Payer: Self-pay | Admitting: Cardiothoracic Surgery

## 2012-06-06 VITALS — BP 153/76 | HR 75 | Resp 18 | Ht 64.0 in | Wt 103.0 lb

## 2012-06-06 DIAGNOSIS — I359 Nonrheumatic aortic valve disorder, unspecified: Secondary | ICD-10-CM

## 2012-06-06 DIAGNOSIS — Z952 Presence of prosthetic heart valve: Secondary | ICD-10-CM

## 2012-06-06 DIAGNOSIS — I96 Gangrene, not elsewhere classified: Secondary | ICD-10-CM

## 2012-06-06 DIAGNOSIS — Z954 Presence of other heart-valve replacement: Secondary | ICD-10-CM

## 2012-06-06 NOTE — Progress Notes (Signed)
Savannah Roberts [facility] Medical Record #[id number] Date of Birth: [birth year]  Referring: Marylen Ponto, MD Primary Care: Marylen Ponto, MD  Chief Complaint:    Chief Complaint  Patient presents with  . Routine Post Op    4 month f/u S/P AVR on 03/29/2010, recheck wounds on toes    History of Present Illness:     Patient returns today for followup check . It's been a proximally 27 months since her aortic valve replacement. Functionally she seems much improved. The toes of both feet completely healed. She remains active at home. Without syncope or chest pain.    Past Medical History  Diagnosis Date  . Atrial fibrillation   . PAC (premature atrial contraction)   . PVC (premature ventricular contraction)   . Hypertension   . Tachycardia-bradycardia syndrome   . DVT (deep vein thrombosis) in pregnancy   . Emphysema   . S/P aortic valve replacement     for severe symptomatic aortic stenosis by Dr Tyrone Sage  . S/P mastectomy   . Breast cancer     Past Surgical History  Procedure Date  . Appendectomy   . Mastectomy   . Aortic valve replacement 03/29/2010     #21 pericardial tissue valve [employer] model #3300TFX serical number [id number]    History  Smoking status  . Former Smoker  Smokeless tobacco  . Not on file   History  Alcohol Use No    History   Social History  . Marital Status: Married    Spouse Name: N/A    Number of Children: N/A  . Years of Education: N/A   Occupational History  . Not on file.   Social History Main Topics  . Smoking status: Former Games developer  . Smokeless tobacco: Not on file  . Alcohol Use: No  . Drug Use: No  . Sexually Active:    Other Topics Concern  . Not on file   Social History Narrative  . No narrative on file    Allergies  Allergen Reactions  . Hydrocodone   . Rosuvastatin     REACTION: Reaction not known    Current Outpatient Prescriptions  Medication Sig Dispense Refill  . amiodarone (PACERONE) 200  MG tablet Take 1 tablet (200 mg total) by mouth daily.  90 tablet  3  . aspirin 81 MG tablet Take 81 mg by mouth daily.        . cilostazol (PLETAL) 100 MG tablet Take 100 mg by mouth 2 (two) times daily.        . Cyanocobalamin (VITAMIN B-12 IJ) Inject as directed every 30 (thirty) days.        . fenofibrate 160 MG tablet Take 160 mg by mouth daily.        . fluticasone (VERAMYST) 27.5 MCG/SPRAY nasal spray Place 2 sprays into the nose daily.        . furosemide (LASIX) 40 MG tablet Take one by mouth daily  90 tablet  3  . Iron Combinations (FE PLUS PROTEIN) 25 MG TABS Take by mouth 2 (two) times daily.        . metoprolol (LOPRESSOR) 50 MG tablet Take 1 tablet (50 mg total) by mouth 2 (two) times daily.  180 tablet  3  . Nutritional Supplements (BOOST PLUS PO) Take by mouth as needed.        . potassium chloride (KLOR-CON) 20 MEQ packet Take 20 mEq by mouth daily.  90 tablet  3  .  silver sulfADIAZINE (SILVADENE) 1 % cream Apply topically daily.  400 g  3  . traMADol (ULTRAM) 50 MG tablet Take 50 mg by mouth every 6 (six) hours as needed.        . warfarin (COUMADIN) 2 MG tablet Take by mouth as directed.       . warfarin (COUMADIN) 3 MG tablet Take as directed by Anticoagulation clinic  90 tablet  1      Family History  Problem Relation Age of Onset  . Breast cancer Mother   . Stroke Sister   . Diabetes Brother   . Heart attack Brother      Review of Systems:     Cardiac Review of Systems: Y or N  Chest Pain [   n ]  Resting SOB [n   ] Exertional SOB  [ y ]  Orthopnea [ n ]   Pedal Edema [ n  ]    Palpitations [ y ] Syncope  [ n ]   Presyncope [ n  ]  General Review of Systems: [Y] = yes [  ]=no Constitional: recent weight change [  ]; anorexia [  ]; fatigue [  ]; nausea [  ]; night sweats [  ]; fever [  ]; or chills [  ];                                                                                                                                          Dental: poor  dentition[  ];   Eye : blurred vision [  ]; diplopia [   ]; vision changes [  ];  Amaurosis fugax[  ]; Resp: cough [  ];  wheezing[  ];  hemoptysis[  ]; shortness of breath[  ]; paroxysmal nocturnal dyspnea[  ]; dyspnea on exertion[  ]; or orthopnea[  ];  GI:  gallstones[  ], vomiting[  ];  dysphagia[  ]; melena[  ];  hematochezia [  ]; heartburn[  ];   Hx of  Colonoscopy[  ]; GU: kidney stones [  ]; hematuria[  ];   dysuria [  ];  nocturia[  ];  history of     obstruction [  ];             Skin: rash, swelling[  ];, hair loss[  ];  peripheral edema[  ];  or itching[  ]; Musculosketetal: myalgias[  ];  joint swelling[  ];  joint erythema[  ];  joint pain[  ];  back pain[  ];  Heme/Lymph: bruising[  ];  bleeding[  ];  anemia[  ];  Neuro: TIA[  ];  headaches[  ];  stroke[  ];  vertigo[  ];  seizures[  ];   paresthesias[  ];  difficulty walking[  ];  Psych:depression[  ]; anxiety[  ];  Endocrine: diabetes[  ];  thyroid dysfunction[  ];  Immunizations: Flu Cove.Etienne  ]; Pneumococcal[ y ];  Other:  Physical Exam: BP 153/76  Pulse 75  Resp 18  Ht 5\' 4"  (1.626 m)  Wt 103 lb (46.72 kg)  BMI 17.68 kg/m2  SpO2 96%  General appearance: alert, cooperative and no distress Neurologic: intact Heart: regular rate and rhythm, S1, S2 normal, no murmur, click, rub or gallop and normal apical impulse Lungs: clear to auscultation bilaterally Abdomen: soft, non-tender; bowel sounds normal; no masses,  no organomegaly Extremities: The toes on the patient's right foot continued to improve. The  third toe still have some eschar which is close to falling off there is no evidence of active infection the remaining toes have completely heal. Her left foot and fingers have completely healed Wound: Patient's sternum is stable and well healed   Diagnostic Studies & Laboratory data:      Recent ECHO:  Transthoracic Echocardiography  Patient: Savannah Roberts, Savannah Roberts MR #: [id number] Study Date: 01/31/2012 Gender: F Age:  78 Height: 162.6cm Weight: 46.7kg BSA: 1.35m^2 Pt. Status: Room:  ATTENDING Nahser, Artelia Laroche, MD REFERRING Hillis Range, MD SONOGRAPHER Aida Raider, RDCS PERFORMING [facility], [facility] cc:  ------------------------------------------------------------ LV EF: 60% - 65%  ------------------------------------------------------------ Indications: 424.1 Aortic valve disorders.  ------------------------------------------------------------ History: PMH: Acquired from the patient and from the patient's chart. PMH: Tachy-Brady Syndrome. Peripheral Vascular Disease. Atrial Fibrillation. Carotid Bruit. Dizziness. PAC. PVC. Risk factors: Former tobacco use. Hypertension.  ------------------------------------------------------------ Study Conclusions  - Left ventricle: The cavity size was normal. Wall thickness was increased in a pattern of mild LVH. Systolic function was normal. The estimated ejection fraction was in the range of 60% to 65%. Wall motion was normal; there were no regional wall motion abnormalities. Features are consistent with a pseudonormal left ventricular filling pattern, with concomitant abnormal relaxation and increased filling pressure (grade 2 diastolic dysfunction). E/medial e' > 15 suggests increased LV end diastolic pressure. - Aortic valve: Bioprosthetic aortic valve. No significant prosthetic valve stenosis. Mild eccentric jet of aortic insufficiency. Mean gradient: 13mm Hg (S). Peak gradient: 24mm Hg (S). - Mitral valve: Mildly to moderately calcified, mildly fibrotic annulus. Mild to moderate regurgitation. - Left atrium: The atrium was moderately dilated. - Right ventricle: The cavity size was normal. Systolic function was normal. - Right atrium: The atrium was mildly dilated. - Tricuspid valve: Mild-moderate regurgitation. Peak RV-RA gradient: 36mm Hg (S). - Pulmonary arteries: PA systolic pressure 42-46 mmHg. - Systemic  veins: IVC measured 1.8 cm with normal respirophasic variation, suggesting RA pressure 6-10 mmHg. Impressions:  - Normal LV size with mild LV hypertrophy, EF 60-65%. Moderate diastolic dysfunction. There is evidence for elevated LV filling pressure. Bioprosthetic aortic valve appears to be functioning normally, there is mild eccentric aortic regurgitation. Mild to moderate mitral regurgitation. Mild pulmonary hypertension.  ------------------------------------------------------------ Labs, prior tests, procedures, and surgery: Status post Left Mastectomy. Status post Aortic Valve Replacment- March 29, 2010 with #21 Pericardial Tissue [facility]. Transthoracic echocardiography. M-mode, complete 2D, spectral Doppler, and color Doppler. Height: Height: 162.6cm. Height: 64in. Weight: Weight: 46.7kg. Weight: 102.8lb. Body mass index: BMI: 17.7kg/m^2. Body surface area: BSA: 1.69m^2. Blood pressure: 159/87. Patient status: Outpatient. Location: [place] [facility]  ------------------------------------------------------------  ------------------------------------------------------------ Left ventricle: The cavity size was normal. Wall thickness was increased in a pattern of mild LVH. Systolic function was normal. The estimated ejection fraction was in the range of 60% to 65%. Wall motion was normal; there were no regional wall motion abnormalities. Features are consistent with  a pseudonormal left ventricular filling pattern, with concomitant abnormal relaxation and increased filling pressure (grade 2 diastolic dysfunction). E/medial e' > 15 suggests increased LV end diastolic pressure.  ------------------------------------------------------------ Aortic valve: Bioprosthetic aortic valve. No significant prosthetic valve stenosis. Mild eccentric jet of aortic insufficiency. Doppler: VTI ratio of LVOT to aortic valve: 0.47. Peak velocity ratio of LVOT to aortic valve: 0.42.  Mean gradient: 13mm Hg (S). Peak gradient: 24mm Hg (S).  ------------------------------------------------------------ Aorta: Aortic root: The aortic root was normal in size. Ascending aorta: The ascending aorta was normal in size.  ------------------------------------------------------------ Mitral valve: Mildly to moderately calcified, mildly fibrotic annulus. Doppler: There was no evidence for stenosis. Mild to moderate regurgitation. Peak gradient: 5mm Hg (D).  ------------------------------------------------------------ Left atrium: The atrium was moderately dilated.  ------------------------------------------------------------ Right ventricle: The cavity size was normal. Systolic function was normal.  ------------------------------------------------------------ Pulmonic valve: Structurally normal valve. Cusp separation was normal. Doppler: Transvalvular velocity was within the normal range. No regurgitation.  ------------------------------------------------------------ Tricuspid valve: Doppler: Mild-moderate regurgitation.  ------------------------------------------------------------ Pulmonary artery: PA systolic pressure 42-46 mmHg.  ------------------------------------------------------------ Right atrium: The atrium was mildly dilated.  ------------------------------------------------------------ Pericardium: There was no pericardial effusion.  ------------------------------------------------------------ Systemic veins: IVC measured 1.8 cm with normal respirophasic variation, suggesting RA pressure 6-10 mmHg.  ------------------------------------------------------------  2D measurements Normal Doppler Normal Left ventricle measurements LVID ED, 35.1 mm 43-52 Left ventricle chord, Ea, lat 7.11 cm/ ------- PLAX ann, tiss s LVID ES, 26 mm 23-38 DP chord, E/Ea, lat 15.75 ------- PLAX ann, tiss FS, chord, 26 % >29 DP PLAX Ea, med 5.09 cm/ ------- LVPW, ED 11.5  mm ------ ann, tiss s Ventricular septum DP IVS, ED 18.7 mm ------ E/Ea, med 22 ------- Aorta ann, tiss Root diam, 29 mm ------ DP ED LVOT Left atrium Peak vel, S 102 cm/ ------- AP dim 51 mm ------ s AP dim 3.45 cm/m^2 <2.2 VTI, S 21.9 cm ------- index Aortic valve Peak vel, S 244 cm/ ------- s Mean vel, S 171 cm/ ------- s VTI, S 46.8 cm ------- Mean 13 mm ------- gradient, S Hg Peak 24 mm ------- gradient, S Hg VTI ratio 0.47 ------- LVOT/AV Peak vel 0.42 ------- ratio, LVOT/AV Regurg PHT 489 ms ------- Mitral valve Peak E vel 112 cm/ ------- s Peak A vel 79 cm/ ------- s Deceleratio 243 ms 150-230 n time Peak 5 mm ------- gradient, D Hg Peak E/A 1.4 ------- ratio Max regurg 589 cm/ ------- vel s Regurg VTI 191 cm ------- Tricuspid valve Regurg peak 298 cm/ ------- vel s Peak RV-RA 36 mm ------- gradient, S Hg Max regurg 298 cm/ ------- vel s Right ventricle Sa vel, lat 11.8 cm/ ------- ann, tiss s DP  ------------------------------------------------------------ Prepared and Electronically Authenticated by  Marca Ancona, MD 2013-05-22T14:[id number]:32.300   Recent Lab Findings: Lab Results  Component Value Date   WBC 3.9* 06/14/2011   HGB 9.4* 06/14/2011   HCT 28.3* 06/14/2011   PLT 184.0 06/14/2011   GLUCOSE 118* 07/12/2011   ALT 28 10/18/2011   AST 51* 10/18/2011   NA 144 07/12/2011   K 3.8 07/12/2011   CL 106 07/12/2011   CREATININE 1.3* 07/12/2011   BUN 19 07/12/2011   CO2 31 07/12/2011   TSH 2.79 10/18/2011   INR 3.1 05/16/2012   HGBA1C  Value: 5.4 (NOTE)  According to the ADA Clinical Practice Recommendations for 2011, when HbA1c is used as a screening test:   >=6.5%   Diagnostic of Diabetes Mellitus           (if abnormal result  is confirmed)  5.7-6.4%   Increased risk of developing Diabetes Mellitus  References:Diagnosis and Classification of Diabetes Mellitus,Diabetes  Care,2011,34(Suppl 1):S62-S69 and Standards of Medical Care in         Diabetes - 2011,Diabetes Care,2011,34  (Suppl 1):S11-S61. 03/24/2010      Assessment / Plan:   Stable aortic valve replacement with good function confirmed on echocardiogram. She is now more then 2 years following surgery has completely healed all of her toes and fingers. with the exception of the right third toe. Overall she is pleased with her progress and notes that she had not had surgery she is sure she would would have died by now is.    Plan to see her back prn.   Delight Ovens MD 06/06/2012 10:24 AM

## 2012-06-06 NOTE — Patient Instructions (Signed)
Doing well Return if have any trouble with feet

## 2012-06-13 ENCOUNTER — Ambulatory Visit (INDEPENDENT_AMBULATORY_CARE_PROVIDER_SITE_OTHER): Payer: Medicare Other | Admitting: *Deleted

## 2012-06-13 DIAGNOSIS — I4891 Unspecified atrial fibrillation: Secondary | ICD-10-CM

## 2012-06-13 DIAGNOSIS — Z7901 Long term (current) use of anticoagulants: Secondary | ICD-10-CM

## 2012-07-11 ENCOUNTER — Ambulatory Visit (INDEPENDENT_AMBULATORY_CARE_PROVIDER_SITE_OTHER): Payer: Medicare Other | Admitting: *Deleted

## 2012-07-11 DIAGNOSIS — Z7901 Long term (current) use of anticoagulants: Secondary | ICD-10-CM

## 2012-07-11 DIAGNOSIS — I4891 Unspecified atrial fibrillation: Secondary | ICD-10-CM

## 2012-07-15 DIAGNOSIS — I7100 Dissection of unspecified site of aorta: Secondary | ICD-10-CM

## 2012-07-15 HISTORY — DX: Dissection of unspecified site of aorta: I71.00

## 2012-07-18 ENCOUNTER — Ambulatory Visit
Admission: RE | Admit: 2012-07-18 | Discharge: 2012-07-18 | Disposition: A | Discharge status: Home / Self Care | Payer: Medicare Other | Source: Ambulatory Visit | Attending: Cardiothoracic Surgery | Admitting: Cardiothoracic Surgery

## 2012-07-18 ENCOUNTER — Other Ambulatory Visit: Payer: Self-pay | Admitting: Cardiothoracic Surgery

## 2012-07-18 ENCOUNTER — Encounter: Payer: Self-pay | Admitting: *Deleted

## 2012-07-18 ENCOUNTER — Encounter: Payer: Self-pay | Admitting: Cardiothoracic Surgery

## 2012-07-18 ENCOUNTER — Ambulatory Visit (INDEPENDENT_AMBULATORY_CARE_PROVIDER_SITE_OTHER): Payer: Medicare Other | Admitting: Cardiothoracic Surgery

## 2012-07-18 VITALS — BP 138/77 | HR 83 | Resp 18 | Ht 63.0 in | Wt 98.0 lb

## 2012-07-18 DIAGNOSIS — J90 Pleural effusion, not elsewhere classified: Secondary | ICD-10-CM

## 2012-07-18 DIAGNOSIS — J189 Pneumonia, unspecified organism: Secondary | ICD-10-CM

## 2012-07-18 DIAGNOSIS — Z954 Presence of other heart-valve replacement: Secondary | ICD-10-CM

## 2012-07-18 DIAGNOSIS — Z952 Presence of prosthetic heart valve: Secondary | ICD-10-CM

## 2012-07-18 DIAGNOSIS — I7100 Dissection of unspecified site of aorta: Secondary | ICD-10-CM

## 2012-07-18 DIAGNOSIS — I729 Aneurysm of unspecified site: Secondary | ICD-10-CM

## 2012-07-18 MED ORDER — AMIODARONE HCL 200 MG PO TABS: 100.0000 mg | ORAL_TABLET | Freq: Every day | ORAL | Status: DC

## 2012-07-18 NOTE — Progress Notes (Signed)
[address]            [place] [address]          [phone]      Savannah Roberts [facility] Medical Record #[id number] Date of Birth: 1929/07/21  Referring: Marylen Ponto, MD Primary Care: Marylen Ponto, MD  Chief Complaint:    Chief Complaint  Patient presents with  . Follow-up    from recent [facility] ER visit with dx of pneumonia and Type A aortic dissection/large left pl effusion    History of Present Illness:    Patient returns to the office today after referring herself from the emergency room in [place]. She had noted some increasing shortness of breath and was noted on chest x-ray to have a left lower lobe infiltrative process and left pleural effusion. She was started on Levaquin, while on amiodarone and Coumadin. She was seen in [facility] emergency room, a CT scan was performed and interpreted as a type I aortic dissection. The patient refused transfer for further treatment at that time and went home. She comes to the office today to review the x-ray findings and discuss options. She notes that her shortness of breath has improved over the past couple days.     Past Medical History  Diagnosis Date  . Atrial fibrillation   . PAC (premature atrial contraction)   . PVC (premature ventricular contraction)   . Hypertension   . Tachycardia-bradycardia syndrome   . DVT (deep vein thrombosis) in pregnancy   . Emphysema   . S/P aortic valve replacement     for severe symptomatic aortic stenosis by Dr Tyrone Sage  . S/P mastectomy   . Breast cancer   . Aortic dissection 07/15/12    Past Surgical History  Procedure Date  . Appendectomy   . Mastectomy   . Aortic valve replacement 03/29/2010     #21 pericardial tissue valve [employer] model #3300TFX serical number [id number]    Family History  Problem Relation Age of Onset  . Breast cancer Mother   . Stroke Sister   . Diabetes Brother   . Heart attack Brother     History    Social History  . Marital Status: Married    Spouse Name: N/A    Number of Children: N/A  . Years of Education: N/A   Occupational History  . Not on file.   Social History Main Topics  . Smoking status: Former Games developer  . Smokeless tobacco: Not on file  . Alcohol Use: No  . Drug Use: No  . Sexually Active:    Other Topics Concern  . Not on file   Social History Narrative  . No narrative on file    History  Smoking status  . Former Smoker  Smokeless tobacco  . Not on file    History  Alcohol Use No     Allergies  Allergen Reactions  . Hydrocodone   . Rosuvastatin     REACTION: Reaction not known    Current Outpatient Prescriptions  Medication Sig Dispense Refill  . aspirin 81 MG tablet Take 81 mg by mouth daily.        . cilostazol (PLETAL) 100 MG tablet Take 100 mg by mouth 2 (two) times daily.        . Cyanocobalamin (VITAMIN B-12 IJ) Inject as directed every 30 (thirty) days.        Marland Kitchen  fenofibrate 160 MG tablet Take 160 mg by mouth daily.        . fluticasone (VERAMYST) 27.5 MCG/SPRAY nasal spray Place 2 sprays into the nose daily.        . furosemide (LASIX) 40 MG tablet Take one by mouth daily  90 tablet  3  . Iron Combinations (FE PLUS PROTEIN) 25 MG TABS Take by mouth 2 (two) times daily.        . metoprolol (LOPRESSOR) 50 MG tablet Take 1 tablet (50 mg total) by mouth 2 (two) times daily.  180 tablet  3  . Nutritional Supplements (BOOST PLUS PO) Take by mouth as needed.        . potassium chloride (KLOR-CON) 20 MEQ packet Take 20 mEq by mouth daily.  90 tablet  3  . traMADol (ULTRAM) 50 MG tablet Take 50 mg by mouth every 6 (six) hours as needed.        . warfarin (COUMADIN) 2 MG tablet Take by mouth as directed.       . warfarin (COUMADIN) 3 MG tablet Take as directed by Anticoagulation clinic  90 tablet  1  . albuterol (PROVENTIL HFA;VENTOLIN HFA) 108 (90 BASE) MCG/ACT inhaler Inhale 2 puffs into the lungs every 4 (four) hours as needed.      Marland Kitchen  amiodarone (PACERONE) 200 MG tablet Take 200 mg by mouth daily. stopped x 10 days while on antx      . Fe Fum-FePoly-Vit C-Vit B3 (INTEGRA PO) Take by mouth.      . levofloxacin (LEVAQUIN) 500 MG tablet Take 500 mg by mouth daily. X 10 days      . [DISCONTINUED] amiodarone (PACERONE) 200 MG tablet Take 1 tablet (200 mg total) by mouth daily.  90 tablet  3       Review of Systems:     Cardiac Review of Systems: Y or N  Chest Pain [ n   ]  Resting SOB [ y  ] Exertional SOB  Cove.Etienne  ]  Orthopnea [ n ]   Pedal Edema [ n  ]    Palpitations [ n ] Syncope  [ n ]   Presyncope [  n ]  General Review of Systems: [Y] = yes [  ]=no Constitional: recent weight change [  ]; anorexia [  ]; fatigue [  ]; nausea [  ]; night sweats [  ]; fever [  ]; or chills [  ];                                                                                                                                          Dental: poor dentition[  ]; Last Dentist visit:   Eye : blurred vision [ n ]; diplopia [   ]; vision changes [  ];  Amaurosis fugax[  ]; Resp: cough [  ];  wheezing[ n ];  hemoptysis[ n ]; shortness of breath[ y ]; paroxysmal nocturnal dyspnea[  ]; dyspnea on exertion[  ]; or orthopnea[  ];  GI:  gallstones[  ], vomiting[  ];  dysphagia[  ]; melena[  ];  hematochezia [  ]; heartburn[  ];   Hx of  Colonoscopy[  ]; GU: kidney stones [  ]; hematuria[  ];   dysuria [  ];  nocturia[  ];  history of     obstruction [  ];             Skin: rash, swelling[  ];, hair loss[  ];  peripheral edema[  ];  or itching[  ]; Musculosketetal: myalgias[  ];  joint swelling[  ];  joint erythema[  ];  joint pain[  ];  back pain[  ];  Heme/Lymph: bruising[  ];  bleeding[  ];  anemia[  ];  Neuro: TIA[  ];  headaches[  ];  stroke[  ];  vertigo[  ];  seizures[  ];   paresthesias[  ];  difficulty walking[  ];  Psych:depression[  ]; anxiety[  ];  Endocrine: diabetes[  ];  thyroid dysfunction[  ];  Immunizations: Flu [  ]; Pneumococcal[   ];  Other:  Physical Exam: BP 138/77  Pulse 83  Resp 18  Ht 5\' 3"  (1.6 m)  Wt 98 lb (44.453 kg)  BMI 17.36 kg/m2  SpO2 95%  General appearance: alert, cooperative, appears older than stated age, cachectic, fatigued and no distress Neurologic: intact Heart: regular rate and rhythm, S1, S2 normal, no murmur, click, rub or gallop and regular rate and rhythm Lungs: clear to auscultation bilaterally and normal percussion bilaterally Abdomen: soft, non-tender; bowel sounds normal; no masses,  no organomegaly Extremities: extremities normal, atraumatic, no cyanosis or edema and no edema, redness or tenderness in the calves or thighs Wound: sternum stable   Diagnostic Studies & Laboratory data:     Recent Radiology Findings:   Dg Chest 2 View  07/18/2012  *RADIOLOGY REPORT*  Clinical Data: Follow-up aortic pseudoaneurysm.  CHEST - 2 VIEW  Comparison: CT and radiographs 07/15/2012.  Findings: The heart size and mediastinal contours are stable status post median sternotomy and aortic valve replacement.  The pseudoaneurysm and dissection involving the ascending aorta demonstrated on CT are not well visualized.  Left pleural effusion has decreased in volume.  There is improved aeration of the left lung base.  No edema or pneumothorax is seen.  There is a stable convex right scoliosis.  Surgical clips are present within the left axilla.  IMPRESSION:  1.  Interval improved left pleural effusion and left basilar aeration. 2.  Grossly stable dilatation of the ascending aorta corresponding with a type A dissection and pseudoaneurysm on recent CT.   Original Report Authenticated By: Carey Bullocks, M.D.     CT from [place]: CT ANGIOGRAPHY CHEST  Technique: Multidetector CT imaging of the chest using the standard protocol during bolus administration of intravenous contrast. Multiplanar reconstructed images including MIPs were obtained and reviewed to evaluate the vascular anatomy.  Contrast: 80 ml  Isovue 370. Per technologist report, extravasation of approximately 20-25 ml of contrast occurred during the injection.  Comparison: None.  Findings: Contrast opacification of pulmonary arteries is good. No filling defects within either main pulmonary artery or their branches in either lung to suggest pulmonary embolism. However, there are filling defects within branches of the left inferior pulmonary vein. No other pulmonary venous filling defects are identified.  Prior aortic valve replacement.  Dissection involving the descending thoracic aorta, with contrast opacification of the false lumen. The dissection extends to the arch, but does not involve the origins of the great vessels. There is no evidence of dissection involving the descending thoracic aorta or the visualized upper abdominal aorta.  Heart enlarged with biatrial enlargement and left ventricular hypertrophy. No pericardial effusion.  Severe bullous emphysematous changes throughout both lungs. Moderately large left pleural effusion. Dense consolidation in the left lower lobe. Mosaic attenuation throughout both lungs, likely a manifestation of the severe COPD. Minimal right pleural effusion.  Scattered mildly enlarged lymph nodes throughout the mediastinum, the largest a precarinal node approximating 2.0 x 1.4 cm. Visualized thyroid gland unremarkable.  Hyperdense liver without focal hepatic parenchymal abnormality. Atrophic left kidney. Remaining visualized upper abdomen unremarkable. Bone window images demonstrate thoracic spondylosis, severe osteopenia, and exaggeration of the usual thoracic kyphosis.  IMPRESSION:  1. No evidence of pulmonary embolism. 2. Pulmonary venous thrombosis involving branches of the left inferior pulmonary vein.  3. Left lower lobe pneumonia. Moderately large left pleural effusion. 4. Severe COPD/emphysema. 5. Prior aortic valve replacement. Type A aortic dissection extending to  the aortic arch, without involvement of the proximal great vessels or the descending thoracic aorta. 6. Hyperdense liver, likely a manifestation of amiodarone therapy 7. Atrophic left kidney. 8. Likely reactive mediastinal lymphadenopathy.   Recent Lab Findings: Lab Results  Component Value Date   WBC 3.9* 06/14/2011   HGB 9.4* 06/14/2011   HCT 28.3* 06/14/2011   PLT 184.0 06/14/2011   GLUCOSE 118* 07/12/2011   ALT 28 10/18/2011   AST 51* 10/18/2011   NA 144 07/12/2011   K 3.8 07/12/2011   CL 106 07/12/2011   CREATININE 1.3* 07/12/2011   BUN 19 07/12/2011   CO2 31 07/12/2011   TSH 2.79 10/18/2011   INR 3.1 07/11/2012   HGBA1C  Value: 5.4 (NOTE)                                                                       According to the ADA Clinical Practice Recommendations for 2011, when HbA1c is used as a screening test:   >=6.5%   Diagnostic of Diabetes Mellitus           (if abnormal result  is confirmed)  5.7-6.4%   Increased risk of developing Diabetes Mellitus  References:Diagnosis and Classification of Diabetes Mellitus,Diabetes Care,2011,34(Suppl 1):S62-S69 and Standards of Medical Care in         Diabetes - 2011,Diabetes Care,2011,34  (Suppl 1):S11-S61. 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 But regardless it bodes a poor prognosis. I've reviewed the CT scan films with the patient and her husband in detail. The risks of surgery in the risks of the observation are discussed also. Considering the prolonged recovery time after the initial aortic valve procedure 2 years ago the patient is not interested in any further operative intervention. I discussed the case with Dr. Johney Frame, we will stop her Coumadin and decrease the amiodarone dose to 100 mg once a day. He  has an appointment to see her in 1-2  weeks and I'll see her back in one to 2 weeks after that.  It appears that the Levaquin has improved her pulmonary status she notes that she's breathing better than earlier in the week, the chest x-ray today shows resolving left lower lobe atelectasis and effusion.      Delight Ovens MD  Beeper [phone] Office [phone] 07/18/2012 10:08 AM

## 2012-07-18 NOTE — Patient Instructions (Signed)
Stop Coumadin  Amiodarone ( Cordarone)  resume at 100 mg per day

## 2012-08-02 ENCOUNTER — Ambulatory Visit (INDEPENDENT_AMBULATORY_CARE_PROVIDER_SITE_OTHER): Payer: Medicare Other | Admitting: Internal Medicine

## 2012-08-02 ENCOUNTER — Encounter: Payer: Self-pay | Admitting: Internal Medicine

## 2012-08-02 VITALS — BP 152/69 | HR 86 | Ht 63.0 in | Wt 99.4 lb

## 2012-08-02 DIAGNOSIS — I7100 Dissection of unspecified site of aorta: Secondary | ICD-10-CM

## 2012-08-02 DIAGNOSIS — I5031 Acute diastolic (congestive) heart failure: Secondary | ICD-10-CM

## 2012-08-02 DIAGNOSIS — I10 Essential (primary) hypertension: Secondary | ICD-10-CM

## 2012-08-02 DIAGNOSIS — I4891 Unspecified atrial fibrillation: Secondary | ICD-10-CM

## 2012-08-02 LAB — BASIC METABOLIC PANEL
Calcium: 8.7 mg/dL (ref 8.4–10.5)
GFR: 39.28 mL/min — ABNORMAL LOW (ref >60.00)
Potassium: 3.6 mEq/L (ref 3.5–5.1)
Sodium: 140 mEq/L (ref 135–145)

## 2012-08-02 MED ORDER — FUROSEMIDE 40 MG PO TABS: 40.0000 mg | ORAL_TABLET | Freq: Every day | ORAL | Status: DC

## 2012-08-02 MED ORDER — SILVER SULFADIAZINE 1 % EX CREA: TOPICAL_CREAM | Freq: Every day | CUTANEOUS | Status: DC

## 2012-08-02 MED ORDER — POTASSIUM CHLORIDE CRYS ER 20 MEQ PO TBCR: 20.0000 meq | EXTENDED_RELEASE_TABLET | Freq: Every day | ORAL | Status: DC

## 2012-08-02 NOTE — Patient Instructions (Addendum)
Your physician recommends that you schedule a follow-up appointment in: 4 weeks with Sunday Spillers and 8 weeks with Dr Johney Frame  Your physician has requested that you have an echocardiogram. Echocardiography is a painless test that uses sound waves to create images of your heart. It provides your doctor with information about the size and shape of your heart and how well your heart's chambers and valves are working. This procedure takes approximately one hour. There are no restrictions for this procedure.  Your physician recommends that you return for lab work today BMP  2 Gram Low Sodium Diet A 2 gram sodium diet restricts the amount of sodium in the diet to no more than 2 g or 2000 mg daily. Limiting the amount of sodium is often used to help lower blood pressure. It is important if you have heart, liver, or kidney problems. Many foods contain sodium for flavor and sometimes as a preservative. When the amount of sodium in a diet needs to be low, it is important to know what to look for when choosing foods and drinks. The following includes some information and guidelines to help make it easier for you to adapt to a low sodium diet. QUICK TIPS  Do not add salt to food.  Avoid convenience items and fast food.  Choose unsalted snack foods.  Buy lower sodium products, often labeled as "lower sodium" or "no salt added."  Check food labels to learn how much sodium is in 1 serving.  When eating at a restaurant, ask that your food be prepared with less salt or none, if possible. READING FOOD LABELS FOR SODIUM INFORMATION The nutrition facts label is a good place to find how much sodium is in foods. Look for products with no more than 500 to 600 mg of sodium per meal and no more than 150 mg per serving. Remember that 2 g = 2000 mg. The food label may also list foods as:  Sodium-free: Less than 5 mg in a serving.  Very low sodium: 35 mg or less in a serving.  Low-sodium: 140 mg or less in a  serving.  Light in sodium: 50% less sodium in a serving. For example, if a food that usually has 300 mg of sodium is changed to become light in sodium, it will have 150 mg of sodium.  Reduced sodium: 25% less sodium in a serving. For example, if a food that usually has 400 mg of sodium is changed to reduced sodium, it will have 300 mg of sodium. CHOOSING FOODS Grains  Avoid: Salted crackers and snack items. Some cereals, including instant hot cereals. Bread stuffing and biscuit mixes. Seasoned rice or pasta mixes.  Choose: Unsalted snack items. Low-sodium cereals, oats, puffed wheat and rice, shredded wheat. English muffins and bread. Pasta. Meats  Avoid: Salted, canned, smoked, spiced, pickled meats, including fish and poultry. Bacon, ham, sausage, cold cuts, hot dogs, anchovies.  Choose: Low-sodium canned tuna and salmon. Fresh or frozen meat, poultry, and fish. Dairy  Avoid: Processed cheese and spreads. Cottage cheese. Buttermilk and condensed milk. Regular cheese.  Choose: Milk. Low-sodium cottage cheese. Yogurt. Sour cream. Low-sodium cheese. Fruits and Vegetables  Avoid: Regular canned vegetables. Regular canned tomato sauce and paste. Frozen vegetables in sauces. Olives. Rosita Fire. Relishes. Sauerkraut.  Choose: Low-sodium canned vegetables. Low-sodium tomato sauce and paste. Frozen or fresh vegetables. Fresh and frozen fruit. Condiments  Avoid: Canned and packaged gravies. Worcestershire sauce. Tartar sauce. Barbecue sauce. Soy sauce. Steak sauce. Ketchup. Onion, garlic, and table  salt. Meat flavorings and tenderizers.  Choose: Fresh and dried herbs and spices. Low-sodium varieties of mustard and ketchup. Lemon juice. Tabasco sauce. Horseradish. SAMPLE 2 GRAM SODIUM MEAL PLAN Breakfast / Sodium (mg)  1 cup low-fat milk / 143 mg  2 slices whole-wheat toast / 270 mg  1 tbs heart-healthy margarine / 153 mg  1 hard-boiled egg / 139 mg  1 small orange / 0 mg Lunch /  Sodium (mg)  1 cup raw carrots / 76 mg   cup hummus / 298 mg  1 cup low-fat milk / 143 mg   cup red grapes / 2 mg  1 whole-wheat pita bread / 356 mg Dinner / Sodium (mg)  1 cup whole-wheat pasta / 2 mg  1 cup low-sodium tomato sauce / 73 mg  3 oz lean ground beef / 57 mg  1 small side salad (1 cup raw spinach leaves,  cup cucumber,  cup yellow bell pepper) with 1 tsp olive oil and 1 tsp red wine vinegar / 25 mg Snack / Sodium (mg)  1 container low-fat vanilla yogurt / 107 mg  3 graham cracker squares / 127 mg Nutrient Analysis  Calories: 2033  Protein: 77 g  Carbohydrate: 282 g  Fat: 72 g  Sodium: 1971 mg Document Released: 08/28/2005 Document Revised: 11/20/2011 Document Reviewed: 11/29/2009 [facility] Patient Information 2013 [place], [place].

## 2012-08-04 DIAGNOSIS — I5031 Acute diastolic (congestive) heart failure: Secondary | ICD-10-CM | POA: Insufficient documentation

## 2012-08-04 NOTE — Assessment & Plan Note (Signed)
She has had recently progressive CHF symptoms and remains volume overloaded on exam. As her symptoms are improving with lasix 40mg  BID, we will continue this regimen.  Check BMET at this time. Obtain an echo to evaluate for any acute changes. 2 gram sodium diet  Follow-up with Savannah Roberts in 4 weeks.

## 2012-08-04 NOTE — Assessment & Plan Note (Signed)
I agree with stopping coumadin given the patient's fragility at this time Continue amiodarone

## 2012-08-04 NOTE — Assessment & Plan Note (Signed)
Per Dr. Gerhardt 

## 2012-08-04 NOTE — Assessment & Plan Note (Signed)
Above goal Continue lasix and follow Would not add more beta blocker given prior bradycardia Consider adding an ace inhibitor if creatinine remains stable

## 2012-08-04 NOTE — Progress Notes (Signed)
PCP: Marylen Ponto, MD  Savannah Roberts is a 78 y.o. female who presents today for routine cardiology followup.  Since last being seen in our clinic, the patient has had difficulty with pnemonia/ effusion and aortic false aneurysm.  I have discussed at length with Dr Tyrone Sage and is recent notes are reviewed.  She presented recently with SOB, orthopnea, and worsened edema.  Her lasix was increased last week by Dr Tyrone Sage.  Her symptoms have significantly improved since that time. Today, she denies symptoms of palpitations, chest pain, dizziness, presyncope, or syncope.  The patient is otherwise without complaint today.   Past Medical History  Diagnosis Date  . Atrial fibrillation   . PAC (premature atrial contraction)   . PVC (premature ventricular contraction)   . Hypertension   . Tachycardia-bradycardia syndrome   . DVT (deep vein thrombosis) in pregnancy   . Emphysema   . S/P aortic valve replacement     for severe symptomatic aortic stenosis by Dr Tyrone Sage  . S/P mastectomy   . Breast cancer   . Aortic dissection 07/15/12   Past Surgical History  Procedure Date  . Appendectomy   . Mastectomy   . Aortic valve replacement 03/29/2010     #21 pericardial tissue valve [employer] model #3300TFX serical number [id number]    Current Outpatient Prescriptions  Medication Sig Dispense Refill  . albuterol (PROVENTIL HFA;VENTOLIN HFA) 108 (90 BASE) MCG/ACT inhaler Inhale 2 puffs into the lungs every 4 (four) hours as needed.      Marland Kitchen amiodarone (PACERONE) 200 MG tablet Take 100 mg by mouth daily.      Marland Kitchen aspirin 81 MG tablet Take 81 mg by mouth daily.        . cilostazol (PLETAL) 100 MG tablet Take 100 mg by mouth 2 (two) times daily.        . Cyanocobalamin (VITAMIN B-12 IJ) Inject as directed every 30 (thirty) days.        . Fe Fum-FePoly-Vit C-Vit B3 (INTEGRA PO) Take by mouth.      . fenofibrate 160 MG tablet Take 160 mg by mouth daily.        . fluticasone (VERAMYST) 27.5 MCG/SPRAY  nasal spray Place 2 sprays into the nose daily.        . furosemide (LASIX) 40 MG tablet Take 1 tablet (40 mg total) by mouth daily. Take one by mouth daily  90 tablet  3  . Iron Combinations (FE PLUS PROTEIN) 25 MG TABS Take by mouth 2 (two) times daily.        . metoprolol (LOPRESSOR) 50 MG tablet Take 1 tablet (50 mg total) by mouth 2 (two) times daily.  180 tablet  3  . Nutritional Supplements (BOOST PLUS PO) Take by mouth as needed.        . traMADol (ULTRAM) 50 MG tablet Take 50 mg by mouth every 6 (six) hours as needed.        . potassium chloride SA (K-DUR,KLOR-CON) 20 MEQ tablet Take 1 tablet (20 mEq total) by mouth daily.  90 tablet  3  . silver sulfADIAZINE (SILVADENE) 1 % cream Apply topically daily. Apply as directed  400 g  3    Physical Exam: Filed Vitals:   08/02/12 1104  BP: 152/69  Pulse: 86  Height: 5\' 3"  (1.6 m)  Weight: 99 lb 6.4 oz (45.088 kg)    GEN- The patient is frail and elderly appearing, alert and oriented x 3 today.   Head-  normocephalic, atraumatic Eyes-  Sclera clear, conjunctiva pink Ears- hearing intact Oropharynx- clear Lungs- Clear to ausculation bilaterally, normal work of breathing Heart- Regular rate and rhythm, 4/6 SEM LLSB GI- soft, NT, ND, + BS Extremities- no clubbing, cyanosis, 1+ edema   Assessment and Plan:

## 2012-08-05 ENCOUNTER — Other Ambulatory Visit: Payer: Self-pay | Admitting: Internal Medicine

## 2012-08-05 DIAGNOSIS — I4891 Unspecified atrial fibrillation: Secondary | ICD-10-CM

## 2012-08-05 MED ORDER — SILVER SULFADIAZINE 1 % EX CREA: TOPICAL_CREAM | Freq: Every day | CUTANEOUS | Status: DC

## 2012-08-05 MED ORDER — FUROSEMIDE 40 MG PO TABS: 40.0000 mg | ORAL_TABLET | Freq: Every day | ORAL | Status: DC

## 2012-08-05 MED ORDER — POTASSIUM CHLORIDE CRYS ER 20 MEQ PO TBCR: 20.0000 meq | EXTENDED_RELEASE_TABLET | Freq: Every day | ORAL | Status: DC

## 2012-08-05 NOTE — Telephone Encounter (Signed)
New problem:    Refill went to wrong location.     Need to go to [facility] in [place] potassium cholride  20 meq.    Silver 1% creams - [facility] in [place] .    fuosemide 40 mg.  [facility] [place].

## 2012-08-06 ENCOUNTER — Other Ambulatory Visit: Payer: Self-pay | Admitting: *Deleted

## 2012-08-06 DIAGNOSIS — I4891 Unspecified atrial fibrillation: Secondary | ICD-10-CM

## 2012-08-06 MED ORDER — METOPROLOL TARTRATE 50 MG PO TABS: 50.0000 mg | ORAL_TABLET | Freq: Two times a day (BID) | ORAL | Status: DC

## 2012-08-14 ENCOUNTER — Encounter: Payer: Self-pay | Admitting: Cardiothoracic Surgery

## 2012-08-14 ENCOUNTER — Ambulatory Visit (INDEPENDENT_AMBULATORY_CARE_PROVIDER_SITE_OTHER): Payer: Medicare Other | Admitting: Cardiothoracic Surgery

## 2012-08-14 VITALS — BP 156/80 | HR 83 | Resp 20 | Ht 63.0 in | Wt 98.0 lb

## 2012-08-14 DIAGNOSIS — J90 Pleural effusion, not elsewhere classified: Secondary | ICD-10-CM

## 2012-08-14 DIAGNOSIS — I7100 Dissection of unspecified site of aorta: Secondary | ICD-10-CM

## 2012-08-14 DIAGNOSIS — Z954 Presence of other heart-valve replacement: Secondary | ICD-10-CM

## 2012-08-14 NOTE — Progress Notes (Signed)
[address]            [place] [address]          [phone]         Savannah Roberts [facility] Medical Record #[id number] Date of Birth: May 26, 1929  Referring: Marylen Ponto, MD Primary Care: Marylen Ponto, MD  Chief Complaint:    Chief Complaint  Patient presents with  . Follow-up    1 month f/u     History of Present Illness:    Patient returns to the office today after referring herself from the emergency room in [place]. She had noted some increasing shortness of breath and was noted on chest x-ray to have a left lower lobe infiltrative process and left pleural effusion. She was started on Levaquin, while on amiodarone and Coumadin. She was seen in [facility] emergency room, a CT scan was performed and interpreted as a type I aortic dissection. The patient refused transfer for further treatment at that time and went home. She came to the office several weeks ago to review the x-ray findings and discuss options. She was not interested in pursuing any surgical treatment for the abnormalities noted on CT scan.  She comes in today for followup visit, she has had increasing symptoms of congestive heart failure with pedal edema. This has improved with increasing her Lasix dose to 40 mg twice a day. She still has some edema in the left foot more than the right, and has had paroxysmal nocturnal dyspnea last week but this is improved over the past several days    Past Medical History  Diagnosis Date  . Atrial fibrillation   . PAC (premature atrial contraction)   . PVC (premature ventricular contraction)   . Hypertension   . Tachycardia-bradycardia syndrome   . DVT (deep vein thrombosis) in pregnancy   . Emphysema   . S/P aortic valve replacement     for severe symptomatic aortic stenosis by Dr Tyrone Sage  . S/P mastectomy   . Breast cancer   . Aortic dissection 07/15/12    Past Surgical History  Procedure Date  . Appendectomy   .  Mastectomy   . Aortic valve replacement 03/29/2010     #21 pericardial tissue valve [employer] model #3300TFX serical number [id number]    Family History  Problem Relation Age of Onset  . Breast cancer Mother   . Stroke Sister   . Diabetes Brother   . Heart attack Brother     History   Social History  . Marital Status: Married    Spouse Name: N/A    Number of Children: N/A  . Years of Education: N/A   Occupational History  . Not on file.   Social History Main Topics  . Smoking status: Former Games developer  . Smokeless tobacco: Not on file  . Alcohol Use: No  . Drug Use: No  . Sexually Active:    Other Topics Concern  . Not on file   Social History Narrative  . No narrative on file    History  Smoking status  . Former Smoker  Smokeless tobacco  . Not on file    History  Alcohol Use No     Allergies  Allergen Reactions  . Hydrocodone   . Rosuvastatin  REACTION: Reaction not known    Current Outpatient Prescriptions  Medication Sig Dispense Refill  . albuterol (PROVENTIL HFA;VENTOLIN HFA) 108 (90 BASE) MCG/ACT inhaler Inhale 2 puffs into the lungs every 4 (four) hours as needed.      Marland Kitchen amiodarone (PACERONE) 200 MG tablet Take 100 mg by mouth daily.      Marland Kitchen aspirin 81 MG tablet Take 81 mg by mouth daily.        . cilostazol (PLETAL) 100 MG tablet Take 100 mg by mouth 2 (two) times daily.        . Cyanocobalamin (VITAMIN B-12 IJ) Inject as directed every 30 (thirty) days.        . Fe Fum-FePoly-Vit C-Vit B3 (INTEGRA PO) Take by mouth.      . fenofibrate 160 MG tablet Take 160 mg by mouth daily.        . fluticasone (VERAMYST) 27.5 MCG/SPRAY nasal spray Place 2 sprays into the nose daily.        . furosemide (LASIX) 40 MG tablet Take 40 mg by mouth 2 (two) times daily.      . Iron Combinations (FE PLUS PROTEIN) 25 MG TABS Take by mouth 2 (two) times daily.        . metoprolol (LOPRESSOR) 50 MG tablet Take 1 tablet (50 mg total) by mouth 2 (two) times  daily.  180 tablet  3  . Nutritional Supplements (BOOST PLUS PO) Take by mouth as needed.        . potassium chloride SA (K-DUR,KLOR-CON) 20 MEQ tablet Take 1 tablet (20 mEq total) by mouth daily.  90 tablet  3  . silver sulfADIAZINE (SILVADENE) 1 % cream Apply topically daily. Apply as directed  400 g  3  . traMADol (ULTRAM) 50 MG tablet Take 50 mg by mouth every 6 (six) hours as needed.        . [DISCONTINUED] furosemide (LASIX) 40 MG tablet Take 1 tablet (40 mg total) by mouth daily. Take one by mouth daily  90 tablet  3       Review of Systems:     Cardiac Review of Systems: Y or N  Chest Pain [ n   ]  Resting SOB [ y  ] Exertional SOB  Cove.Etienne  ]  Orthopnea [ n ]   Pedal Edema [ n  ]    Palpitations [ n ] Syncope  [ n ]   Presyncope [  n ]  General Review of Systems: [Y] = yes [  ]=no Constitional: recent weight change [  ]; anorexia [  ]; fatigue [  ]; nausea [  ]; night sweats [  ]; fever [  ]; or chills [  ];  Dental: poor dentition[  ]; Last Dentist visit:   Eye : blurred vision [ n ]; diplopia [   ]; vision changes [  ];  Amaurosis fugax[  ]; Resp: cough [  ];  wheezing[ n ];  hemoptysis[ n ]; shortness of breath[ y ]; paroxysmal nocturnal dyspnea[  ]; dyspnea on exertion[  ]; or orthopnea[  ];  GI:  gallstones[  ], vomiting[  ];  dysphagia[  ]; melena[  ];  hematochezia [  ]; heartburn[  ];   Hx of  Colonoscopy[  ]; GU: kidney stones [  ]; hematuria[  ];   dysuria [  ];  nocturia[  ];  history of     obstruction [  ];             Skin: rash, swelling[  ];, hair loss[  ];  peripheral edema[  ];  or itching[  ]; Musculosketetal: myalgias[  ];  joint swelling[  ];  joint erythema[  ];  joint pain[  ];  back pain[  ];  Heme/Lymph: bruising[  ];  bleeding[  ];  anemia[  ];  Neuro: TIA[  ];  headaches[  ];  stroke[  ];  vertigo[  ];  seizures[  ];    paresthesias[  ];  difficulty walking[  ];  Psych:depression[  ]; anxiety[  ];  Endocrine: diabetes[  ];  thyroid dysfunction[  ];  Immunizations: Flu [  ]; Pneumococcal[  ];  Other:  Physical Exam: BP 156/80  Pulse 83  Resp 20  Ht 5\' 3"  (1.6 m)  Wt 98 lb (44.453 kg)  BMI 17.36 kg/m2  SpO2 95%  General appearance: alert, cooperative, appears older than stated age, cachectic, fatigued and no distress Neurologic: intact Heart: regular rate and rhythm, S1, S2 normal, systolic ejection murmur 3/6 is evident, click, rub or gallop and regular rate and rhythm Lungs: clear to auscultation bilaterally and normal percussion bilaterally Abdomen: soft, non-tender; bowel sounds normal; no masses,  no organomegaly Extremities: extremities normal, atraumatic, no cyanosis or edema and no edema, redness or tenderness in the calves or thighs Wound: sternum stable   Diagnostic Studies & Laboratory data:     Recent Radiology Findings:   No results found.  CT from [place]: CT ANGIOGRAPHY CHEST  Technique: Multidetector CT imaging of the chest using the standard protocol during bolus administration of intravenous contrast. Multiplanar reconstructed images including MIPs were obtained and reviewed to evaluate the vascular anatomy.  Contrast: 80 ml Isovue 370. Per technologist report, extravasation of approximately 20-25 ml of contrast occurred during the injection.  Comparison: None.  Findings: Contrast opacification of pulmonary arteries is good. No filling defects within either main pulmonary artery or their branches in either lung to suggest pulmonary embolism. However, there are filling defects within branches of the left inferior pulmonary vein. No other pulmonary venous filling defects are identified.  Prior aortic valve replacement. Dissection involving the descending thoracic aorta, with contrast opacification of the false lumen. The dissection extends to the arch, but does not  involve the origins of the great vessels. There is no evidence of dissection involving the descending thoracic aorta or the visualized upper abdominal aorta.  Heart enlarged with biatrial enlargement and left ventricular hypertrophy. No pericardial effusion.  Severe bullous emphysematous changes throughout both lungs. Moderately large left pleural effusion. Dense consolidation in the left lower lobe. Mosaic attenuation throughout both lungs, likely a manifestation of the severe COPD. Minimal right pleural effusion.  Scattered mildly enlarged  lymph nodes throughout the mediastinum, the largest a precarinal node approximating 2.0 x 1.4 cm. Visualized thyroid gland unremarkable.  Hyperdense liver without focal hepatic parenchymal abnormality. Atrophic left kidney. Remaining visualized upper abdomen unremarkable. Bone window images demonstrate thoracic spondylosis, severe osteopenia, and exaggeration of the usual thoracic kyphosis.  IMPRESSION:  1. No evidence of pulmonary embolism. 2. Pulmonary venous thrombosis involving branches of the left inferior pulmonary vein.  3. Left lower lobe pneumonia. Moderately large left pleural effusion. 4. Severe COPD/emphysema. 5. Prior aortic valve replacement. Type A aortic dissection extending to the aortic arch, without involvement of the proximal great vessels or the descending thoracic aorta. 6. Hyperdense liver, likely a manifestation of amiodarone therapy 7. Atrophic left kidney. 8. Likely reactive mediastinal lymphadenopathy.   Recent Lab Findings: Lab Results  Component Value Date   WBC 3.9* 06/14/2011   HGB 9.4* 06/14/2011   HCT 28.3* 06/14/2011   PLT 184.0 06/14/2011   GLUCOSE 94 08/02/2012   ALT 28 10/18/2011   AST 51* 10/18/2011   NA 140 08/02/2012   K 3.6 08/02/2012   CL 104 08/02/2012   CREATININE 1.4* 08/02/2012   BUN 26* 08/02/2012   CO2 28 08/02/2012   TSH 2.79 10/18/2011   INR 3.1 07/11/2012   HGBA1C  Value: 5.4  (NOTE)                                                                       According to the ADA Clinical Practice Recommendations for 2011, when HbA1c is used as a screening test:   >=6.5%   Diagnostic of Diabetes Mellitus           (if abnormal result  is confirmed)  5.7-6.4%   Increased risk of developing Diabetes Mellitus  References:Diagnosis and Classification of Diabetes Mellitus,Diabetes Care,2011,34(Suppl 1):S62-S69 and Standards of Medical Care in         Diabetes - 2011,Diabetes Care,2011,34  (Suppl 1):S11-S61. 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 But regardless it bodes a poor prognosis. I've reviewed the CT scan films with the patient and her husband in detail. The risks of surgery in the risks of the observation are discussed also. Considering the prolonged recovery time after the initial aortic valve procedure 2 years ago the patient is not interested in any further operative intervention.   Considering the radiographically findings the patient is remarkably stable though she does manageable symptoms of congestive heart failure. Again today we discussed any surgical intervention which she is not interested in. She has an appointment to see Dr. Johney Frame in 2 weeks we'll plan to see her back in approximately 4 weeks    Delight Ovens MD  Beeper [phone] Office [phone] 08/14/2012 5:36 PM

## 2012-08-15 ENCOUNTER — Ambulatory Visit (HOSPITAL_COMMUNITY): Payer: Medicare Other | Attending: Cardiology | Admitting: Radiology

## 2012-08-15 ENCOUNTER — Ambulatory Visit: Payer: Medicare Other | Admitting: Cardiothoracic Surgery

## 2012-08-15 DIAGNOSIS — R011 Cardiac murmur, unspecified: Secondary | ICD-10-CM | POA: Insufficient documentation

## 2012-08-15 DIAGNOSIS — J438 Other emphysema: Secondary | ICD-10-CM | POA: Insufficient documentation

## 2012-08-15 DIAGNOSIS — I4891 Unspecified atrial fibrillation: Secondary | ICD-10-CM | POA: Insufficient documentation

## 2012-08-15 DIAGNOSIS — I059 Rheumatic mitral valve disease, unspecified: Secondary | ICD-10-CM | POA: Insufficient documentation

## 2012-08-15 DIAGNOSIS — I359 Nonrheumatic aortic valve disorder, unspecified: Secondary | ICD-10-CM

## 2012-08-15 DIAGNOSIS — I517 Cardiomegaly: Secondary | ICD-10-CM | POA: Insufficient documentation

## 2012-08-15 DIAGNOSIS — I10 Essential (primary) hypertension: Secondary | ICD-10-CM | POA: Insufficient documentation

## 2012-08-15 DIAGNOSIS — C50919 Malignant neoplasm of unspecified site of unspecified female breast: Secondary | ICD-10-CM | POA: Insufficient documentation

## 2012-08-15 DIAGNOSIS — I5020 Unspecified systolic (congestive) heart failure: Secondary | ICD-10-CM | POA: Insufficient documentation

## 2012-08-15 DIAGNOSIS — I079 Rheumatic tricuspid valve disease, unspecified: Secondary | ICD-10-CM | POA: Insufficient documentation

## 2012-08-15 NOTE — Progress Notes (Signed)
Echocardiogram performed.  

## 2012-08-26 ENCOUNTER — Ambulatory Visit (INDEPENDENT_AMBULATORY_CARE_PROVIDER_SITE_OTHER): Payer: Medicare Other | Admitting: Nurse Practitioner

## 2012-08-26 ENCOUNTER — Encounter: Payer: Self-pay | Admitting: Nurse Practitioner

## 2012-08-26 VITALS — BP 150/80 | HR 58 | Ht 64.0 in | Wt 92.8 lb

## 2012-08-26 DIAGNOSIS — I5032 Chronic diastolic (congestive) heart failure: Secondary | ICD-10-CM

## 2012-08-26 LAB — CBC WITH DIFFERENTIAL/PLATELET
Basophils Absolute: 0 10*3/uL (ref 0.0–0.1)
Basophils Relative: 0.7 % (ref 0.0–3.0)
Eosinophils Absolute: 0.1 10*3/uL (ref 0.0–0.7)
Eosinophils Relative: 2.8 % (ref 0.0–5.0)
HCT: 30.8 % — ABNORMAL LOW (ref 36.0–46.0)
Hemoglobin: 10.1 g/dL — ABNORMAL LOW (ref 12.0–15.0)
Lymphocytes Relative: 23.2 % (ref 12.0–46.0)
Lymphs Abs: 0.8 10*3/uL (ref 0.7–4.0)
MCHC: 32.6 g/dL (ref 30.0–36.0)
MCV: 93.2 fl (ref 78.0–100.0)
Monocytes Absolute: 0.5 10*3/uL (ref 0.1–1.0)
Monocytes Relative: 13.7 % — ABNORMAL HIGH (ref 3.0–12.0)
Neutro Abs: 2 10*3/uL (ref 1.4–7.7)
Neutrophils Relative %: 59.6 % (ref 43.0–77.0)
Platelets: 243 10*3/uL (ref 150.0–400.0)
RBC: 3.31 Mil/uL — ABNORMAL LOW (ref 3.87–5.11)
RDW: 17.1 % — ABNORMAL HIGH (ref 11.5–14.6)
WBC: 3.4 10*3/uL — ABNORMAL LOW (ref 4.5–10.5)

## 2012-08-26 LAB — BASIC METABOLIC PANEL
BUN: 26 mg/dL — ABNORMAL HIGH (ref 6–23)
CO2: 29 mEq/L (ref 19–32)
Calcium: 9.2 mg/dL (ref 8.4–10.5)
Chloride: 101 mEq/L (ref 96–112)
Creatinine, Ser: 1.3 mg/dL — ABNORMAL HIGH (ref 0.4–1.2)
GFR: 40.64 mL/min — ABNORMAL LOW (ref >60.00)
Glucose, Bld: 98 mg/dL (ref 70–99)
Potassium: 3.4 mEq/L — ABNORMAL LOW (ref 3.5–5.1)
Sodium: 140 mEq/L (ref 135–145)

## 2012-08-26 MED ORDER — LISINOPRIL 5 MG PO TABS: 5.0000 mg | ORAL_TABLET | Freq: Every day | ORAL | Status: DC

## 2012-08-26 NOTE — Patient Instructions (Addendum)
We need to check labs today.   Cut the Lasix back to just one a day. You may take an extra dose if your swelling gets worse, you gain 3 pounds overnight.   Avoid salt.  We are going to start Lisinopril 5 mg a day.   I will see you in 3 weeks  Keep elevating your legs and use support stockings.   Call the [facility] office at [phone] if you have any questions, problems or concerns.

## 2012-08-26 NOTE — Progress Notes (Signed)
Savannah Roberts Date of Birth: Oct 07, 1928 Medical Record #[id number]  History of Present Illness: Savannah Roberts is seen back today for a one one month check. She is seen for Dr. Johney Frame. She has an extensive past medical history which includes atrial fib, PACs, PVCs, HTN, tachy brady, DVT, emphysema and prior AVR in July of 2011 which was associated with a very long hospitalization. She has most recently been having more shortness of breath. Seen in the ER in [place] and had a CT which was interpreted as a type I aortic dissection. She refused further treatment and referred herself back to Dr. Tyrone Sage. 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 The patient is not interested in further operative intervention due to her prolonged course from 2011.   She saw Dr. Johney Frame a month ago for the shortness of breath, orthopnea and worsened edema. Lasix had already been increased. Coumadin was stopped given her general frailty. She has fallen. She remains on amiodarone therapy. Echo was obtained with results as noted below. Not felt that we could add more beta blocker given her prior bradycardia. Could consider ACE if renal function remains stable.   She comes in today. She is here with her husband. She is still pretty fragile. Her swelling has improved. Her breathing has improved. She says she is not short of breath. Sleeps during the day and stays up at night. Cutting back on salt. BP at home has been averaging in the 130's.   Current Outpatient Prescriptions on File Prior to Visit  Medication Sig Dispense Refill  . albuterol (PROVENTIL HFA;VENTOLIN HFA) 108 (90 BASE) MCG/ACT inhaler Inhale 2 puffs into the lungs every 4 (four) hours as needed.      Marland Kitchen amiodarone (PACERONE) 200 MG tablet Take 100 mg by mouth daily.      Marland Kitchen aspirin 81 MG tablet Take 81 mg by mouth daily.        . cilostazol (PLETAL) 100 MG tablet Take 100 mg by mouth 2 (two) times daily.          . Cyanocobalamin (VITAMIN B-12 IJ) Inject as directed every 30 (thirty) days.        . Fe Fum-FePoly-Vit C-Vit B3 (INTEGRA PO) Take by mouth.      . fenofibrate 160 MG tablet Take 160 mg by mouth daily.        . fluticasone (VERAMYST) 27.5 MCG/SPRAY nasal spray Place 2 sprays into the nose daily.        . furosemide (LASIX) 40 MG tablet Take 40 mg by mouth 2 (two) times daily.      . Iron Combinations (FE PLUS PROTEIN) 25 MG TABS Take by mouth 2 (two) times daily.        . metoprolol (LOPRESSOR) 50 MG tablet Take 1 tablet (50 mg total) by mouth 2 (two) times daily.  180 tablet  3  . Nutritional Supplements (BOOST PLUS PO) Take by mouth as needed.        . potassium chloride SA (K-DUR,KLOR-CON) 20 MEQ tablet Take 1 tablet (20 mEq total) by mouth daily.  90 tablet  3  . silver sulfADIAZINE (SILVADENE) 1 % cream Apply topically daily. Apply as directed  400 g  3  . traMADol (ULTRAM) 50 MG tablet Take 50 mg by mouth every 6 (six) hours as needed.        Marland Kitchen lisinopril (PRINIVIL,ZESTRIL) 5 MG tablet Take 1 tablet (5 mg total) by  mouth daily.  30 tablet  3    Allergies  Allergen Reactions  . Hydrocodone   . Rosuvastatin     REACTION: Reaction not known    Past Medical History  Diagnosis Date  . Atrial fibrillation   . PAC (premature atrial contraction)   . PVC (premature ventricular contraction)   . Hypertension   . Tachycardia-bradycardia syndrome   . DVT (deep vein thrombosis) in pregnancy   . Emphysema   . S/P aortic valve replacement     for severe symptomatic aortic stenosis by Dr Tyrone Sage  . S/P mastectomy   . Breast cancer   . Aortic dissection 07/15/12    Past Surgical History  Procedure Date  . Appendectomy   . Mastectomy   . Aortic valve replacement 03/29/2010     #21 pericardial tissue valve [employer] model #3300TFX serical number [id number]    History  Smoking status  . Former Smoker  Smokeless tobacco  . Not on file    History  Alcohol Use No     Family History  Problem Relation Age of Onset  . Breast cancer Mother   . Stroke Sister   . Diabetes Brother   . Heart attack Brother     Review of Systems: The review of systems is per the HPI.  All other systems were reviewed and are negative.  Physical Exam: BP 150/80  Pulse 58  Ht 5\' 4"  (1.626 m)  Wt 92 lb 12.8 oz (42.094 kg)  BMI 15.93 kg/m2 Patient is very pleasant and in no acute distress. She does look frail. She is quite thin. Her weight is down 7 pounds over the past 3 1/2 weeks. Skin is warm and dry. Color is normal.  HEENT is unremarkable. Normocephalic/atraumatic. PERRL. Sclera are nonicteric. Neck is supple. No masses. No JVD. Lungs are clear. Cardiac exam shows a regular rate and rhythm with 3/6 systolic murmur. Abdomen is soft. Extremities are with just a slight trace edema over the left foot. Gait and ROM are intact. No gross neurologic deficits noted.  LABORATORY DATA: Pending  Echo Study Conclusions  - Left ventricle: The cavity size was normal. Wall thickness was increased in a pattern of mild LVH. There was mild focal basal hypertrophy of the septum. Systolic function was normal. The estimated ejection fraction was in the range of 55% to 60%. Wall motion was normal; there were no regional wall motion abnormalities. Features are consistent with a pseudonormal left ventricular filling pattern, with concomitant abnormal relaxation and increased filling pressure (grade 2 diastolic dysfunction). Doppler parameters are consistent with high ventricular filling pressure. - Aortic valve: A bioprosthesis was present. Mild regurgitation. Mean gradient: 10mm Hg (S). Peak gradient: 18mm Hg (S). - Mitral valve: Calcified annulus. Mildly thickened leaflets . Moderate regurgitation. - Left atrium: The atrium was moderately dilated. - Right atrium: The atrium was mildly dilated. - Pulmonary arteries: Systolic pressure was severely increased. PA peak pressure: 63mm  Hg (S).  Lab Results  Component Value Date   WBC 3.9* 06/14/2011   HGB 9.4* 06/14/2011   HCT 28.3* 06/14/2011   PLT 184.0 06/14/2011   GLUCOSE 94 08/02/2012   ALT 28 10/18/2011   AST 51* 10/18/2011   NA 140 08/02/2012   K 3.6 08/02/2012   CL 104 08/02/2012   CREATININE 1.4* 08/02/2012   BUN 26* 08/02/2012   CO2 28 08/02/2012   TSH 2.79 10/18/2011   INR 3.1 07/11/2012   HGBA1C  Value: 5.4 (NOTE)  According to the ADA Clinical Practice Recommendations for 2011, when HbA1c is used as a screening test:   >=6.5%   Diagnostic of Diabetes Mellitus           (if abnormal result  is confirmed)  5.7-6.4%   Increased risk of developing Diabetes Mellitus  References:Diagnosis and Classification of Diabetes Mellitus,Diabetes Care,2011,34(Suppl 1):S62-S69 and Standards of Medical Care in         Diabetes - 2011,Diabetes Care,2011,34  (Suppl 1):S11-S61. 03/24/2010     Assessment / Plan: 1. Diastolic heart failure - We will try some low dose ACE. I have cut the Lasix back to 40 mg a day. I encouraged her to try and weigh daily and use an extra dose of Lasix prn weight gain of 3 pounds overnight. Elevate legs/avoid salt/support stockings also encouraged. Will check follow up labs today. I will see her back in about 3 weeks.   2. False aneurysm - she is not interested in any further surgical procedures.  3. HTN - has better control at home and we still have room to start and titrate medicines  4. PAF - on low dose amiodarone - no longer on coumadin given her falls/frailty.  I will see her back in about 3 weeks. Check labs today. Start low dose ACE.  Patient is agreeable to this plan and will call if any problems develop in the interim.

## 2012-09-17 ENCOUNTER — Encounter: Payer: Self-pay | Admitting: Nurse Practitioner

## 2012-09-17 ENCOUNTER — Ambulatory Visit (INDEPENDENT_AMBULATORY_CARE_PROVIDER_SITE_OTHER): Payer: Medicare Other | Admitting: Nurse Practitioner

## 2012-09-17 ENCOUNTER — Other Ambulatory Visit: Payer: Self-pay

## 2012-09-17 VITALS — BP 128/70 | HR 76 | Ht 64.0 in | Wt 94.0 lb

## 2012-09-17 DIAGNOSIS — I4891 Unspecified atrial fibrillation: Secondary | ICD-10-CM

## 2012-09-17 DIAGNOSIS — I5032 Chronic diastolic (congestive) heart failure: Secondary | ICD-10-CM

## 2012-09-17 LAB — CBC WITH DIFFERENTIAL/PLATELET
Basophils Absolute: 0 10*3/uL (ref 0.0–0.1)
Basophils Relative: 0.6 % (ref 0.0–3.0)
Eosinophils Absolute: 0.1 10*3/uL (ref 0.0–0.7)
Eosinophils Relative: 3.6 % (ref 0.0–5.0)
HCT: 27.2 % — ABNORMAL LOW (ref 36.0–46.0)
Hemoglobin: 9.1 g/dL — ABNORMAL LOW (ref 12.0–15.0)
Lymphocytes Relative: 20.2 % (ref 12.0–46.0)
Lymphs Abs: 0.7 10*3/uL (ref 0.7–4.0)
MCHC: 33.5 g/dL (ref 30.0–36.0)
MCV: 91.5 fl (ref 78.0–100.0)
Monocytes Absolute: 0.5 10*3/uL (ref 0.1–1.0)
Monocytes Relative: 15.9 % — ABNORMAL HIGH (ref 3.0–12.0)
Neutro Abs: 1.9 10*3/uL (ref 1.4–7.7)
Neutrophils Relative %: 59.7 % (ref 43.0–77.0)
Platelets: 200 10*3/uL (ref 150.0–400.0)
RBC: 2.98 Mil/uL — ABNORMAL LOW (ref 3.87–5.11)
RDW: 16.4 % — ABNORMAL HIGH (ref 11.5–14.6)
WBC: 3.2 10*3/uL — ABNORMAL LOW (ref 4.5–10.5)

## 2012-09-17 LAB — BASIC METABOLIC PANEL
BUN: 26 mg/dL — ABNORMAL HIGH (ref 6–23)
CO2: 28 mEq/L (ref 19–32)
Calcium: 9 mg/dL (ref 8.4–10.5)
Chloride: 103 mEq/L (ref 96–112)
Creatinine, Ser: 1.1 mg/dL (ref 0.4–1.2)
GFR: 50.59 mL/min — ABNORMAL LOW (ref >60.00)
Glucose, Bld: 111 mg/dL — ABNORMAL HIGH (ref 70–99)
Potassium: 3.4 mEq/L — ABNORMAL LOW (ref 3.5–5.1)
Sodium: 139 mEq/L (ref 135–145)

## 2012-09-17 MED ORDER — LISINOPRIL 5 MG PO TABS: 5.0000 mg | ORAL_TABLET | Freq: Two times a day (BID) | ORAL | Status: DC

## 2012-09-17 NOTE — Progress Notes (Signed)
Savannah Roberts Date of Birth: 1928/10/22 Medical Record #[id number]  History of Present Illness: Savannah Roberts is seen back today for a 3 week check. She is seen for Dr. Johney Frame. She has an an extensive past medical history which includes atrial fib, PACs, PVCs, HTN, tachy brady, DVT, emphysema and prior AVR in July of 2011 which was associated with a very long hospitalization. She has most recently been having more shortness of breath. Seen in the ER in [place] and had a CT which was interpreted as a type I aortic dissection. She refused further treatment and referred herself back to Dr. Tyrone Sage. She actually has a significant size false aneurysm involving the ascending aorta instead of a dissection. The patient is not interested in further operative intervention due to her prolonged course from 2011. Other issue includes diastolic heart failure, general frailty, and remains on amiodarone. No longer on coumadin given her frailty.   She comes in today. She is here with her husband. She seems to be holding her own. Still weak and fragile. Continues to have trouble sleeping at night but naps several times during the course of the day. Has some dyspnea when she lies down that improves with sitting up. Has started eating a little better. Husband tried cutting back the Lasix but she had recurrent swelling in her feet. She is back on 2 pills a day. BP at home is higher with readings 130 to 150 systolic and heart rates in the 70 to 80's. She seems to be doing ok on her ACE. Needs recheck of her labs today. She does cough and reports very thick phlegm that is white in color. No fever or chills. No reports of coughing/choking with meals/drinks.   Current Outpatient Prescriptions on File Prior to Visit  Medication Sig Dispense Refill  . albuterol (PROVENTIL HFA;VENTOLIN HFA) 108 (90 BASE) MCG/ACT inhaler Inhale 2 puffs into the lungs every 4 (four) hours as needed.      Marland Kitchen amiodarone (PACERONE) 200 MG tablet Take 100  mg by mouth daily.      Marland Kitchen aspirin 81 MG tablet Take 81 mg by mouth daily.        . cilostazol (PLETAL) 100 MG tablet Take 100 mg by mouth 2 (two) times daily.        . Cyanocobalamin (VITAMIN B-12 IJ) Inject as directed every 30 (thirty) days.        . Fe Fum-FePoly-Vit C-Vit B3 (INTEGRA PO) Take by mouth.      . fenofibrate 160 MG tablet Take 160 mg by mouth daily.        . fluticasone (VERAMYST) 27.5 MCG/SPRAY nasal spray Place 2 sprays into the nose daily.        . furosemide (LASIX) 40 MG tablet Take 40 mg by mouth 2 (two) times daily.      . Iron Combinations (FE PLUS PROTEIN) 25 MG TABS Take by mouth 2 (two) times daily.        Marland Kitchen lisinopril (PRINIVIL,ZESTRIL) 5 MG tablet Take 1 tablet (5 mg total) by mouth 2 (two) times daily.  180 tablet  3  . metoprolol (LOPRESSOR) 50 MG tablet Take 1 tablet (50 mg total) by mouth 2 (two) times daily.  180 tablet  3  . Nutritional Supplements (BOOST PLUS PO) Take by mouth as needed.        . potassium chloride SA (K-DUR,KLOR-CON) 20 MEQ tablet Take 1 tablet (20 mEq total) by mouth daily.  90 tablet  3  .  silver sulfADIAZINE (SILVADENE) 1 % cream Apply topically daily. Apply as directed  400 g  3  . traMADol (ULTRAM) 50 MG tablet Take 50 mg by mouth every 6 (six) hours as needed.          Allergies  Allergen Reactions  . Hydrocodone   . Rosuvastatin     REACTION: Reaction not known    Past Medical History  Diagnosis Date  . Atrial fibrillation   . PAC (premature atrial contraction)   . PVC (premature ventricular contraction)   . Hypertension   . Tachycardia-bradycardia syndrome   . DVT (deep venous thrombosis)   . Emphysema   . S/P aortic valve replacement     for severe symptomatic aortic stenosis by Dr Tyrone Sage  . S/P mastectomy   . Breast cancer   . Aortic dissection 07/15/12    Past Surgical History  Procedure Date  . Appendectomy   . Mastectomy   . Aortic valve replacement 03/29/2010     #21 pericardial tissue valve [employer] model #3300TFX serical number [id number]    History  Smoking status  . Former Smoker  Smokeless tobacco  . Not on file    History  Alcohol Use No    Family History  Problem Relation Age of Onset  . Breast cancer Mother   . Stroke Sister   . Diabetes Brother   . Heart attack Brother     Review of Systems: The review of systems is per the HPI.  All other systems were reviewed and are negative.  Physical Exam: BP 128/70  Pulse 76  Ht 5\' 4"  (1.626 m)  Wt 94 lb (42.638 kg)  BMI 16.14 kg/m2 Patient is very pleasant and in no acute distress. She is quite frail and deconditioned in appearance. Very thin. Weight is up 2 pounds. Skin is warm and dry. Color is normal.  HEENT is unremarkable. Normocephalic/atraumatic. PERRL. Sclera are nonicteric. Neck is supple. No masses. No JVD. Lungs are clear. Cardiac exam shows a regular rate and rhythm. She has a gallop as well as a 3/6 systolic murmur noted. Abdomen is soft. Extremities are without significant edema today. Gait and ROM are intact. No gross neurologic deficits noted.   LABORATORY DATA: Pending  Lab Results  Component Value Date   WBC 3.4* 08/26/2012   HGB 10.1* 08/26/2012   HCT 30.8* 08/26/2012   PLT 243.0 08/26/2012   GLUCOSE 98 08/26/2012   ALT 28 10/18/2011   AST 51* 10/18/2011   NA 140 08/26/2012   K 3.4* 08/26/2012   CL 101 08/26/2012   CREATININE 1.3* 08/26/2012   BUN 26* 08/26/2012   CO2 29 08/26/2012   TSH 2.79 10/18/2011   INR 3.1 07/11/2012   HGBA1C  Value: 5.4 (NOTE)                                                                       According to the ADA Clinical Practice Recommendations for 2011, when HbA1c is used as a screening test:   >=6.5%   Diagnostic of Diabetes Mellitus           (if abnormal result  is confirmed)  5.7-6.4%   Increased risk of developing Diabetes Mellitus  References:Diagnosis and Classification  of Diabetes Mellitus,Diabetes Care,2011,34(Suppl 1):S62-S69 and Standards of  Medical Care in         Diabetes - 2011,Diabetes Care,2011,34  (Suppl 1):S11-S61. 03/24/2010   Assessment / Plan: 1. Diastolic heart failure - I have increased her Lisinopril to 5 mg BID. Recheck BMET and CBC today. While she remains quite frail I do not think she has lost ground over these past 3 weeks. Husband will titrate Lasix as needed. We have also talked about support stockings again and the need for salt restriction. Her overall prognosis is quite poor.   2. False aneurysm - she is not interested in further surgical procedures.   3. HTN - still with plenty of room to titrate medicines  4. PAF - remains on amiodarone.  She sees Dr. Johney Frame back later this month. Lisinopril is increased today. Recheck her labs today. Continue with supportive care.   Patient is agreeable to this plan and will call if any problems develop in the interim.

## 2012-09-17 NOTE — Patient Instructions (Addendum)
Stay on your current medicines but we are going to increase the Lisinopril to 2 times a day  Use plain Mucinex to help thin your cough/phlegm  See Dr. Johney Frame later this month as planned  We will check labs today  Ok to try knee high support stockings  Call the [facility] office at [phone] if you have any questions, problems or concerns.

## 2012-09-19 ENCOUNTER — Encounter: Payer: Self-pay | Admitting: Cardiothoracic Surgery

## 2012-09-19 ENCOUNTER — Ambulatory Visit (INDEPENDENT_AMBULATORY_CARE_PROVIDER_SITE_OTHER): Payer: Medicare Other | Admitting: Cardiothoracic Surgery

## 2012-09-19 VITALS — BP 153/81 | HR 79 | Resp 16 | Ht 63.0 in | Wt 98.0 lb

## 2012-09-19 DIAGNOSIS — I729 Aneurysm of unspecified site: Secondary | ICD-10-CM

## 2012-09-19 NOTE — Progress Notes (Signed)
[address]            [place] [address]          [phone]           KAURI GARSON [facility] Medical Record #[id number] Date of Birth: [birth year]  Referring: Marylen Ponto, MD Primary Care: Marylen Ponto, MD  Chief Complaint:    No chief complaint on file.   History of Present Illness:    Patient returns to the office today after referring herself from the emergency room in [place]. She had noted some increasing shortness of breath and was noted on chest x-ray to have a left lower lobe infiltrative process and left pleural effusion. She was started on Levaquin, while on amiodarone and Coumadin. She was seen in [facility] emergency room, a CT scan was performed and interpreted as a type I aortic dissection. The patient refused transfer for further treatment at that time and went home. She came to the office several weeks ago to review the x-ray findings and discuss options. She was not interested in pursuing any surgical treatment for the abnormalities noted on CT scan.  She comes in today for followup visit, she has had increasing symptoms of congestive heart failure with pedal edema. This has improved with increasing her Lasix dose to 40 mg twice a day. She still has some edema in the left foot more than the right, and has had paroxysmal nocturnal dyspnea last week but this is improved over the past several days    Past Medical History  Diagnosis Date  . Atrial fibrillation   . PAC (premature atrial contraction)   . PVC (premature ventricular contraction)   . Hypertension   . Tachycardia-bradycardia syndrome   . DVT (deep venous thrombosis)   . Emphysema   . S/P aortic valve replacement     for severe symptomatic aortic stenosis by Dr Tyrone Sage  . S/P mastectomy   . Breast cancer   . Aortic dissection 07/15/12    Past Surgical History  Procedure Date  . Appendectomy   . Mastectomy   . Aortic valve  replacement 03/29/2010     #21 pericardial tissue valve [employer] model #3300TFX serical number [id number]    Family History  Problem Relation Age of Onset  . Breast cancer Mother   . Stroke Sister   . Diabetes Brother   . Heart attack Brother     History   Social History  . Marital Status: Married    Spouse Name: N/A    Number of Children: N/A  . Years of Education: N/A   Occupational History  . Not on file.   Social History Main Topics  . Smoking status: Former Games developer  . Smokeless tobacco: Not on file  . Alcohol Use: No  . Drug Use: No  . Sexually Active: No   Other Topics Concern  . Not on file   Social History Narrative  . No narrative on file    History  Smoking status  . Former Smoker  Smokeless tobacco  . Not on file    History  Alcohol Use No     Allergies  Allergen Reactions  . Hydrocodone   .  Rosuvastatin     REACTION: Reaction not known    Current Outpatient Prescriptions  Medication Sig Dispense Refill  . albuterol (PROVENTIL HFA;VENTOLIN HFA) 108 (90 BASE) MCG/ACT inhaler Inhale 2 puffs into the lungs every 4 (four) hours as needed.      Marland Kitchen amiodarone (PACERONE) 200 MG tablet Take 100 mg by mouth daily.      Marland Kitchen aspirin 81 MG tablet Take 81 mg by mouth daily.        . cilostazol (PLETAL) 100 MG tablet Take 100 mg by mouth 2 (two) times daily.        . Cyanocobalamin (VITAMIN B-12 IJ) Inject as directed every 30 (thirty) days.        . Fe Fum-FePoly-Vit C-Vit B3 (INTEGRA PO) Take by mouth.      . fenofibrate 160 MG tablet Take 160 mg by mouth daily.        . fluticasone (VERAMYST) 27.5 MCG/SPRAY nasal spray Place 2 sprays into the nose daily.        . furosemide (LASIX) 40 MG tablet Take 40 mg by mouth 2 (two) times daily.      . Iron Combinations (FE PLUS PROTEIN) 25 MG TABS Take by mouth 2 (two) times daily.        Marland Kitchen lisinopril (PRINIVIL,ZESTRIL) 5 MG tablet Take 1 tablet (5 mg total) by mouth 2 (two) times daily.  180 tablet  3    . metoprolol (LOPRESSOR) 50 MG tablet Take 1 tablet (50 mg total) by mouth 2 (two) times daily.  180 tablet  3  . Nutritional Supplements (BOOST PLUS PO) Take by mouth as needed.        . potassium chloride SA (K-DUR,KLOR-CON) 20 MEQ tablet Take 1 tablet (20 mEq total) by mouth daily.  90 tablet  3  . silver sulfADIAZINE (SILVADENE) 1 % cream Apply topically daily. Apply as directed  400 g  3  . traMADol (ULTRAM) 50 MG tablet Take 50 mg by mouth every 6 (six) hours as needed.             Review of Systems:     Cardiac Review of Systems: Y or N  Chest Pain [ n   ]  Resting SOB [ y  ] Exertional SOB  Savannah Roberts  ]  Orthopnea [ n ]   Pedal Edema [ n  ]    Palpitations [ n ] Syncope  [ n ]   Presyncope [  n ]  General Review of Systems: [Y] = yes [  ]=no Constitional: recent weight change [  ]; anorexia [  ]; fatigue [  ]; nausea [  ]; night sweats [  ]; fever [  ]; or chills [  ];  Dental: poor dentition[  ]; Last Dentist visit:   Eye : blurred vision [ n ]; diplopia [   ]; vision changes [  ];  Amaurosis fugax[  ]; Resp: cough [  ];  wheezing[ n ];  hemoptysis[ n ]; shortness of breath[ y ]; paroxysmal nocturnal dyspnea[  ]; dyspnea on exertion[  ]; or orthopnea[  ];  GI:  gallstones[  ], vomiting[  ];  dysphagia[  ]; melena[  ];  hematochezia [  ]; heartburn[  ];   Hx of  Colonoscopy[  ]; GU: kidney stones [  ]; hematuria[  ];   dysuria [  ];  nocturia[  ];  history of     obstruction [  ];             Skin: rash, swelling[  ];, hair loss[  ];  peripheral edema[  ];  or itching[  ]; Musculosketetal: myalgias[  ];  joint swelling[  ];  joint erythema[  ];  joint pain[  ];  back pain[  ];  Heme/Lymph: bruising[  ];  bleeding[  ];  anemia[  ];  Neuro: TIA[  ];  headaches[  ];  stroke[  ];  vertigo[  ];  seizures[  ];   paresthesias[  ];  difficulty walking[   ];  Psych:depression[  ]; anxiety[  ];  Endocrine: diabetes[  ];  thyroid dysfunction[  ];  Immunizations: Flu [  ]; Pneumococcal[  ];  Other:  Physical Exam: There were no vitals taken for this visit.  General appearance: alert, cooperative, appears older than stated age, cachectic, fatigued and no distress Neurologic: intact Heart: regular rate and rhythm, S1, S2 normal, systolic ejection murmur 3/6 is evident, click, rub or gallop and regular rate and rhythm Lungs: clear to auscultation bilaterally and normal percussion bilaterally Abdomen: soft, non-tender; bowel sounds normal; no masses,  no organomegaly Extremities: extremities normal, atraumatic, no cyanosis or edema and no edema, redness or tenderness in the calves or thighs Wound: sternum stable   Diagnostic Studies & Laboratory data:     Recent Radiology Findings:   No results found.  CT from [place]: CT ANGIOGRAPHY CHEST  Technique: Multidetector CT imaging of the chest using the standard protocol during bolus administration of intravenous contrast. Multiplanar reconstructed images including MIPs were obtained and reviewed to evaluate the vascular anatomy.  Contrast: 80 ml Isovue 370. Per technologist report, extravasation of approximately 20-25 ml of contrast occurred during the injection.  Comparison: None.  Findings: Contrast opacification of pulmonary arteries is good. No filling defects within either main pulmonary artery or their branches in either lung to suggest pulmonary embolism. However, there are filling defects within branches of the left inferior pulmonary vein. No other pulmonary venous filling defects are identified.  Prior aortic valve replacement. Dissection involving the descending thoracic aorta, with contrast opacification of the false lumen. The dissection extends to the arch, but does not involve the origins of the great vessels. There is no evidence of dissection involving the  descending thoracic aorta or the visualized upper abdominal aorta.  Heart enlarged with biatrial enlargement and left ventricular hypertrophy. No pericardial effusion.  Severe bullous emphysematous changes throughout both lungs. Moderately large left pleural effusion. Dense consolidation in the left lower lobe. Mosaic attenuation throughout both lungs, likely a manifestation of the severe COPD. Minimal right pleural effusion.  Scattered mildly enlarged lymph nodes throughout the mediastinum, the largest a precarinal node approximating 2.0 x 1.4 cm. Visualized thyroid gland unremarkable.  Hyperdense liver without focal hepatic parenchymal abnormality. Atrophic left kidney. Remaining visualized upper abdomen unremarkable. Bone window images demonstrate thoracic spondylosis, severe osteopenia, and exaggeration of the usual thoracic kyphosis.  IMPRESSION:  1. No evidence of pulmonary embolism. 2. Pulmonary venous thrombosis involving branches of the left inferior pulmonary vein.  3. Left lower lobe pneumonia. Moderately large left pleural effusion. 4. Severe COPD/emphysema. 5. Prior aortic valve replacement. Type A aortic dissection extending to the aortic arch, without involvement of the proximal great vessels or the descending thoracic aorta. 6. Hyperdense liver, likely a manifestation of amiodarone therapy 7. Atrophic left kidney. 8. Likely reactive mediastinal lymphadenopathy.   Recent Lab Findings: Lab Results  Component Value Date   WBC 3.2* 09/17/2012   HGB 9.1* 09/17/2012   HCT 27.2* 09/17/2012   PLT 200.0 09/17/2012   GLUCOSE 111* 09/17/2012   ALT 28 10/18/2011   AST 51* 10/18/2011   NA 139 09/17/2012   K 3.4* 09/17/2012   CL 103 09/17/2012   CREATININE 1.1 09/17/2012   BUN 26* 09/17/2012   CO2 28 09/17/2012   TSH 2.79 10/18/2011   INR 3.1 07/11/2012   HGBA1C  Value: 5.4 (NOTE)                                                                       According to the ADA Clinical  Practice Recommendations for 2011, when HbA1c is used as a screening test:   >=6.5%   Diagnostic of Diabetes Mellitus           (if abnormal result  is confirmed)  5.7-6.4%   Increased risk of developing Diabetes Mellitus  References:Diagnosis and Classification of Diabetes Mellitus,Diabetes Care,2011,34(Suppl 1):S62-S69 and Standards of Medical Care in         Diabetes - 2011,Diabetes Care,2011,34  (Suppl 1):S11-S61. 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 But regardless it bodes a poor prognosis. I've reviewed the CT scan films with the patient and her husband in detail. The risks of surgery in the risks of the observation are discussed also. Considering the prolonged recovery time after the initial aortic valve procedure 2 years ago the patient is not interested in any further operative intervention.   Considering the radiographically findings the patient is remarkably stable though she does manageable symptoms of congestive heart failure. Again today we discussed any surgical intervention which she is not interested in. She has an appointment to see Dr. Johney Frame in 2 weeks we'll plan to see her back in approximately 4 weeks    Delight Ovens MD  Beeper [phone] Office [phone] 09/19/2012 11:50 AM

## 2012-09-19 NOTE — Patient Instructions (Signed)
No changes

## 2012-10-07 ENCOUNTER — Encounter: Payer: Self-pay | Admitting: Internal Medicine

## 2012-10-07 ENCOUNTER — Ambulatory Visit (INDEPENDENT_AMBULATORY_CARE_PROVIDER_SITE_OTHER): Payer: Medicare Other | Admitting: Internal Medicine

## 2012-10-07 ENCOUNTER — Other Ambulatory Visit (INDEPENDENT_AMBULATORY_CARE_PROVIDER_SITE_OTHER): Payer: Medicare Other

## 2012-10-07 VITALS — BP 148/66 | HR 86 | Ht 63.0 in | Wt 92.0 lb

## 2012-10-07 DIAGNOSIS — I4891 Unspecified atrial fibrillation: Secondary | ICD-10-CM

## 2012-10-07 DIAGNOSIS — I5031 Acute diastolic (congestive) heart failure: Secondary | ICD-10-CM

## 2012-10-07 LAB — BASIC METABOLIC PANEL
BUN: 28 mg/dL — ABNORMAL HIGH (ref 6–23)
CO2: 32 mEq/L (ref 19–32)
Calcium: 8.9 mg/dL (ref 8.4–10.5)
Chloride: 102 mEq/L (ref 96–112)
Creatinine, Ser: 1.3 mg/dL — ABNORMAL HIGH (ref 0.4–1.2)
GFR: 42.08 mL/min — ABNORMAL LOW (ref >60.00)
Glucose, Bld: 126 mg/dL — ABNORMAL HIGH (ref 70–99)
Potassium: 3.5 mEq/L (ref 3.5–5.1)
Sodium: 142 mEq/L (ref 135–145)

## 2012-10-07 MED ORDER — FUROSEMIDE 40 MG PO TABS: 40.0000 mg | ORAL_TABLET | Freq: Two times a day (BID) | ORAL | Status: AC

## 2012-10-07 NOTE — Assessment & Plan Note (Signed)
Stable No change required today, poor candidate for anticoagulation

## 2012-10-07 NOTE — Assessment & Plan Note (Signed)
Probably near optimized Continue close outpatient follow-up  Will have Norma Fredrickson see in 2 months.

## 2012-10-07 NOTE — Progress Notes (Signed)
PCP: Marylen Ponto, MD  Savannah Roberts is a 79 y.o. female who presents today for routine cardiology followup. She is stable, without significant changes recently.  Her SOB is stable, though edema is improved.  Today, she denies symptoms of palpitations, chest pain, dizziness, presyncope, or syncope.  The patient is otherwise without complaint today.   Past Medical History  Diagnosis Date  . Atrial fibrillation   . PAC (premature atrial contraction)   . PVC (premature ventricular contraction)   . Hypertension   . Tachycardia-bradycardia syndrome   . DVT (deep venous thrombosis)   . Emphysema   . S/P aortic valve replacement     for severe symptomatic aortic stenosis by Dr Tyrone Sage  . S/P mastectomy   . Breast cancer   . Aortic dissection 07/15/12   Past Surgical History  Procedure Date  . Appendectomy   . Mastectomy   . Aortic valve replacement 03/29/2010     #21 pericardial tissue valve [employer] model #3300TFX serical number [id number]    Current Outpatient Prescriptions  Medication Sig Dispense Refill  . albuterol (PROVENTIL HFA;VENTOLIN HFA) 108 (90 BASE) MCG/ACT inhaler Inhale 2 puffs into the lungs every 4 (four) hours as needed.      Marland Kitchen amiodarone (PACERONE) 200 MG tablet Take 100 mg by mouth daily.      Marland Kitchen aspirin 81 MG tablet Take 81 mg by mouth daily.        . cilostazol (PLETAL) 100 MG tablet Take 100 mg by mouth 2 (two) times daily.        . Cyanocobalamin (VITAMIN B-12 IJ) Inject as directed every 30 (thirty) days.        . Fe Fum-FePoly-Vit C-Vit B3 (INTEGRA PO) Take by mouth.      . fenofibrate 160 MG tablet Take 160 mg by mouth daily.        . fluticasone (VERAMYST) 27.5 MCG/SPRAY nasal spray Place 2 sprays into the nose daily.        . furosemide (LASIX) 40 MG tablet Take 1 tablet (40 mg total) by mouth 2 (two) times daily.  180 tablet  3  . Iron Combinations (FE PLUS PROTEIN) 25 MG TABS Take by mouth 2 (two) times daily.        Marland Kitchen lisinopril  (PRINIVIL,ZESTRIL) 5 MG tablet Take 1 tablet (5 mg total) by mouth 2 (two) times daily.  180 tablet  3  . metoprolol (LOPRESSOR) 50 MG tablet Take 1 tablet (50 mg total) by mouth 2 (two) times daily.  180 tablet  3  . Nutritional Supplements (BOOST PLUS PO) Take by mouth as needed.        . potassium chloride SA (K-DUR,KLOR-CON) 20 MEQ tablet Take 1 tablet (20 mEq total) by mouth daily.  90 tablet  3  . silver sulfADIAZINE (SILVADENE) 1 % cream Apply topically daily. Apply as directed  400 g  3  . traMADol (ULTRAM) 50 MG tablet Take 50 mg by mouth every 6 (six) hours as needed.          Physical Exam: Filed Vitals:   10/07/12 1147  BP: 148/66  Pulse: 86  Height: 5\' 3"  (1.6 m)  Weight: 92 lb (41.731 kg)    GEN- The patient is frail and elderly appearing, alert and oriented x 3 today.   Head- normocephalic, atraumatic Eyes-  Sclera clear, conjunctiva pink Ears- hearing intact Oropharynx- clear Lungs-decreased BS at the bases, normal work of breathing Heart- Regular rate and rhythm, 4/6 SEM  LLSB GI- soft, NT, ND, + BS Extremities- no clubbing, cyanosis, trace edema  ekg today reveals sinus rhythm 81 bpm,   Assessment and Plan:

## 2012-10-07 NOTE — Patient Instructions (Addendum)
Your physician recommends that you schedule a follow-up appointment in: 2 months with Lori Gerhardt,NP and 6 months with Dr Allred  

## 2012-10-09 ENCOUNTER — Ambulatory Visit: Payer: Self-pay | Admitting: Internal Medicine

## 2012-10-24 ENCOUNTER — Ambulatory Visit: Payer: Medicare Other | Admitting: Cardiothoracic Surgery

## 2012-11-12 ENCOUNTER — Other Ambulatory Visit: Payer: Self-pay | Admitting: *Deleted

## 2012-11-12 DIAGNOSIS — I2510 Atherosclerotic heart disease of native coronary artery without angina pectoris: Secondary | ICD-10-CM

## 2012-11-14 ENCOUNTER — Encounter: Payer: Self-pay | Admitting: Cardiothoracic Surgery

## 2012-11-14 ENCOUNTER — Ambulatory Visit (INDEPENDENT_AMBULATORY_CARE_PROVIDER_SITE_OTHER): Payer: Medicare Other | Admitting: Cardiothoracic Surgery

## 2012-11-14 VITALS — BP 146/76 | HR 73 | Resp 16 | Ht 63.0 in | Wt 92.0 lb

## 2012-11-14 DIAGNOSIS — I729 Aneurysm of unspecified site: Secondary | ICD-10-CM

## 2012-11-14 NOTE — Progress Notes (Signed)
[address]       [place] [address]             [phone]            Savannah Roberts [facility] Medical Record #[id number] Date of Birth: 05-04-1929  Referring: Marylen Ponto, MD Primary Care: Marylen Ponto, MD  Chief Complaint:    Chief Complaint  Patient presents with  . Follow-up    5 week f/u    History of Present Illness:     She comes in today for followup visit, she has improvement  of congestive heart failure with decreased pedal edema pedal edema. This has improved with increasing her Lasix dose to 40 mg twice a day.   No dyspnea or nocturnal dyspnea. She notes that when she decreased the dose to 60 mg twice a day the pedal edema returned.    Past Medical History  Diagnosis Date  . Atrial fibrillation   . PAC (premature atrial contraction)   . PVC (premature ventricular contraction)   . Hypertension   . Tachycardia-bradycardia syndrome   . DVT (deep venous thrombosis)   . Emphysema   . S/P aortic valve replacement     for severe symptomatic aortic stenosis by Dr Tyrone Sage  . S/P mastectomy   . Breast cancer   . Aortic dissection 07/15/12    Past Surgical History  Procedure Laterality Date  . Appendectomy    . Mastectomy    . Aortic valve replacement  03/29/2010     #21 pericardial tissue valve [employer] model #3300TFX serical number [id number]    Family History  Problem Relation Age of Onset  . Breast cancer Mother   . Stroke Sister   . Diabetes Brother   . Heart attack Brother     History   Social History  . Marital Status: Married    Spouse Name: N/A    Number of Children: N/A  . Years of Education: N/A   Occupational History  . Not on file.   Social History Main Topics  . Smoking status: Former Games developer  . Smokeless tobacco: Not on file  . Alcohol Use: No  . Drug Use: No  . Sexually Active: No   Other Topics Concern  . Not on file   Social History Narrative  . No  narrative on file    History  Smoking status  . Former Smoker  Smokeless tobacco  . Not on file    History  Alcohol Use No     Allergies  Allergen Reactions  . Hydrocodone   . Rosuvastatin     REACTION: Reaction not known    Current Outpatient Prescriptions  Medication Sig Dispense Refill  . albuterol (PROVENTIL HFA;VENTOLIN HFA) 108 (90 BASE) MCG/ACT inhaler Inhale 2 puffs into the lungs every 4 (four) hours as needed.      Marland Kitchen amiodarone (PACERONE) 200 MG tablet Take 100 mg by mouth daily.      Marland Kitchen aspirin 81 MG tablet Take 81 mg by mouth daily.        . cilostazol (PLETAL) 100 MG tablet Take 100 mg by mouth 2 (two) times daily.        . Cyanocobalamin (VITAMIN B-12 IJ) Inject as directed every 30 (thirty) days.        Marland Kitchen  Fe Fum-FePoly-Vit C-Vit B3 (INTEGRA PO) Take by mouth.      . fenofibrate 160 MG tablet Take 160 mg by mouth daily.        . fluticasone (VERAMYST) 27.5 MCG/SPRAY nasal spray Place 2 sprays into the nose daily.        . furosemide (LASIX) 40 MG tablet Take 1 tablet (40 mg total) by mouth 2 (two) times daily.  180 tablet  3  . lisinopril (PRINIVIL,ZESTRIL) 5 MG tablet Take 1 tablet (5 mg total) by mouth 2 (two) times daily.  180 tablet  3  . metoprolol (LOPRESSOR) 50 MG tablet Take 1 tablet (50 mg total) by mouth 2 (two) times daily.  180 tablet  3  . Nutritional Supplements (BOOST PLUS PO) Take by mouth as needed.        . potassium chloride SA (K-DUR,KLOR-CON) 20 MEQ tablet Take 1 tablet (20 mEq total) by mouth daily.  90 tablet  3  . silver sulfADIAZINE (SILVADENE) 1 % cream Apply topically daily. Apply as directed  400 g  3  . traMADol (ULTRAM) 50 MG tablet Take 50 mg by mouth every 6 (six) hours as needed.         No current facility-administered medications for this visit.       Review of Systems:     Cardiac Review of Systems: Y or N  Chest Pain [ n   ]  Resting SOB [ y  ] Exertional SOB  Cove.Etienne  ]  Orthopnea [ n ]   Pedal Edema [ n  ]    Palpitations  [ n ] Syncope  [ n ]   Presyncope [  n ]  General Review of Systems: [Y] = yes [  ]=no Constitional: recent weight change [  ]; anorexia [  ]; fatigue [  ]; nausea [  ]; night sweats [  ]; fever [  ]; or chills [  ];                                                                                                                                          Dental: poor dentition[  ]; Last Dentist visit:   Eye : blurred vision [ n ]; diplopia [   ]; vision changes [  ];  Amaurosis fugax[  ]; Resp: cough [  ];  wheezing[ n ];  hemoptysis[ n ]; shortness of breath[ y ]; paroxysmal nocturnal dyspnea[  ]; dyspnea on exertion[  ]; or orthopnea[  ];  GI:  gallstones[  ], vomiting[  ];  dysphagia[  ]; melena[  ];  hematochezia [  ]; heartburn[  ];   Hx of  Colonoscopy[  ]; GU: kidney stones [  ]; hematuria[  ];   dysuria [  ];  nocturia[  ];  history of     obstruction [  ];  Skin: rash, swelling[  ];, hair loss[  ];  peripheral edema[  ];  or itching[  ]; Musculosketetal: myalgias[  ];  joint swelling[  ];  joint erythema[  ];  joint pain[  ];  back pain[  ];  Heme/Lymph: bruising[  ];  bleeding[  ];  anemia[  ];  Neuro: TIA[  ];  headaches[  ];  stroke[  ];  vertigo[  ];  seizures[  ];   paresthesias[  ];  difficulty walking[  ];  Psych:depression[  ]; anxiety[  ];  Endocrine: diabetes[  ];  thyroid dysfunction[  ];  Immunizations: Flu [  ]; Pneumococcal[  ];  Other:  Physical Exam: BP 146/76  Pulse 73  Resp 16  Ht 5\' 3"  (1.6 m)  Wt 92 lb (41.731 kg)  BMI 16.3 kg/m2  SpO2 93%  General appearance: alert, cooperative, appears older than stated age, cachectic, fatigued and no distress Neurologic: intact Heart: regular rate and rhythm, S1, S2 normal, systolic ejection murmur 3/6 is evident, click, rub or gallop and regular rate and rhythm Lungs: clear to auscultation bilaterally and normal percussion bilaterally Abdomen: soft, non-tender; bowel sounds normal; no masses,  no  organomegaly Extremities: extremities normal, atraumatic, no cyanosis or edema and no edema, redness or tenderness in the calves or thighs Wound: sternum stable   Diagnostic Studies & Laboratory data:     Recent Radiology Findings:   No results found.  CT from [place]: CT ANGIOGRAPHY CHEST  Technique: Multidetector CT imaging of the chest using the standard protocol during bolus administration of intravenous contrast. Multiplanar reconstructed images including MIPs were obtained and reviewed to evaluate the vascular anatomy.  Contrast: 80 ml Isovue 370. Per technologist report, extravasation of approximately 20-25 ml of contrast occurred during the injection.  Comparison: None.  Findings: Contrast opacification of pulmonary arteries is good. No filling defects within either main pulmonary artery or their branches in either lung to suggest pulmonary embolism. However, there are filling defects within branches of the left inferior pulmonary vein. No other pulmonary venous filling defects are identified.  Prior aortic valve replacement. Dissection involving the descending thoracic aorta, with contrast opacification of the false lumen. The dissection extends to the arch, but does not involve the origins of the great vessels. There is no evidence of dissection involving the descending thoracic aorta or the visualized upper abdominal aorta.  Heart enlarged with biatrial enlargement and left ventricular hypertrophy. No pericardial effusion.  Severe bullous emphysematous changes throughout both lungs. Moderately large left pleural effusion. Dense consolidation in the left lower lobe. Mosaic attenuation throughout both lungs, likely a manifestation of the severe COPD. Minimal right pleural effusion.  Scattered mildly enlarged lymph nodes throughout the mediastinum, the largest a precarinal node approximating 2.0 x 1.4 cm. Visualized thyroid gland unremarkable.  Hyperdense  liver without focal hepatic parenchymal abnormality. Atrophic left kidney. Remaining visualized upper abdomen unremarkable. Bone window images demonstrate thoracic spondylosis, severe osteopenia, and exaggeration of the usual thoracic kyphosis.  IMPRESSION:  1. No evidence of pulmonary embolism. 2. Pulmonary venous thrombosis involving branches of the left inferior pulmonary vein.  3. Left lower lobe pneumonia. Moderately large left pleural effusion. 4. Severe COPD/emphysema. 5. Prior aortic valve replacement. Type A aortic dissection extending to the aortic arch, without involvement of the proximal great vessels or the descending thoracic aorta. 6. Hyperdense liver, likely a manifestation of amiodarone therapy 7. Atrophic left kidney. 8. Likely reactive mediastinal lymphadenopathy.   Recent Lab Findings: Lab Results  Component  Value Date   WBC 3.2* 09/17/2012   HGB 9.1* 09/17/2012   HCT 27.2* 09/17/2012   PLT 200.0 09/17/2012   GLUCOSE 126* 10/07/2012   ALT 28 10/18/2011   AST 51* 10/18/2011   NA 142 10/07/2012   K 3.5 10/07/2012   CL 102 10/07/2012   CREATININE 1.3* 10/07/2012   BUN 28* 10/07/2012   CO2 32 10/07/2012   TSH 2.79 10/18/2011   INR 3.1 07/11/2012   HGBA1C  Value: 5.4 (NOTE)                                                                       According to the ADA Clinical Practice Recommendations for 2011, when HbA1c is used as a screening test:   >=6.5%   Diagnostic of Diabetes Mellitus           (if abnormal result  is confirmed)  5.7-6.4%   Increased risk of developing Diabetes Mellitus  References:Diagnosis and Classification of Diabetes Mellitus,Diabetes Care,2011,34(Suppl 1):S62-S69 and Standards of Medical Care in         Diabetes - 2011,Diabetes Care,2011,34  (Suppl 1):S11-S61. 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 But regardless it bodes a poor prognosis. I've reviewed the CT scan films with the patient and her husband in detail. The risks of surgery in the risks of the observation are discussed also. Considering the prolonged recovery time after the initial aortic valve procedure 2 years ago the patient is not interested in any further operative intervention.   Heart failure symptoms have improved but still needs high dose lasix. Will see in 3 months  Delight Ovens MD  Beeper [phone] Office [phone] 11/14/2012 10:35 AM

## 2012-11-15 ENCOUNTER — Other Ambulatory Visit: Payer: Self-pay | Admitting: Nurse Practitioner

## 2012-11-15 MED ORDER — AMIODARONE HCL 200 MG PO TABS: 100.0000 mg | ORAL_TABLET | Freq: Every day | ORAL | Status: DC

## 2012-11-18 ENCOUNTER — Other Ambulatory Visit: Payer: Self-pay | Admitting: *Deleted

## 2012-11-18 ENCOUNTER — Telehealth: Payer: Self-pay | Admitting: *Deleted

## 2012-11-18 NOTE — Telephone Encounter (Signed)
Pt aware, the Pharmacy notified  pt about the prescription.

## 2012-11-18 NOTE — Telephone Encounter (Signed)
Message copied by Harriet Butte on Mon Nov 18, 2012  4:50 PM ------      Message from: Rosalio Macadamia      Created: Fri Nov 15, 2012  7:32 AM       Can someone please call and let him know that I refilled this to [facility]            lori      ----- Message -----         From: Particia Nearing, RN         Sent: 11/14/2012  10:19 AM           To: Rosalio Macadamia, NP            Here today for her appointment and husband is requesting Amiodarone refill. I told him I would notify you.Marland KitchenMarland KitchenThanks and have a great weekend!!       ------

## 2012-11-18 NOTE — Telephone Encounter (Signed)
Called in amiodarone 100 mg daily disp 45 refills 3

## 2012-11-18 NOTE — Telephone Encounter (Signed)
lmom we refilled pts amiodarone 100 mg daily to [facility]

## 2012-12-06 ENCOUNTER — Encounter: Payer: Self-pay | Admitting: Nurse Practitioner

## 2012-12-06 ENCOUNTER — Ambulatory Visit (INDEPENDENT_AMBULATORY_CARE_PROVIDER_SITE_OTHER): Payer: Medicare Other | Admitting: Nurse Practitioner

## 2012-12-06 VITALS — BP 160/90 | HR 70 | Ht 64.0 in | Wt 92.1 lb

## 2012-12-06 DIAGNOSIS — Z79899 Other long term (current) drug therapy: Secondary | ICD-10-CM

## 2012-12-06 DIAGNOSIS — I5032 Chronic diastolic (congestive) heart failure: Secondary | ICD-10-CM

## 2012-12-06 LAB — CBC WITH DIFFERENTIAL/PLATELET
Basophils Absolute: 0 10*3/uL (ref 0.0–0.1)
Basophils Relative: 0.6 % (ref 0.0–3.0)
Eosinophils Absolute: 0.1 10*3/uL (ref 0.0–0.7)
Eosinophils Relative: 4 % (ref 0.0–5.0)
HCT: 30.7 % — ABNORMAL LOW (ref 36.0–46.0)
Hemoglobin: 10.5 g/dL — ABNORMAL LOW (ref 12.0–15.0)
Lymphocytes Relative: 26.2 % (ref 12.0–46.0)
Lymphs Abs: 0.8 10*3/uL (ref 0.7–4.0)
MCHC: 34.1 g/dL (ref 30.0–36.0)
MCV: 90.8 fl (ref 78.0–100.0)
Monocytes Absolute: 0.4 10*3/uL (ref 0.1–1.0)
Monocytes Relative: 12.6 % — ABNORMAL HIGH (ref 3.0–12.0)
Neutro Abs: 1.7 10*3/uL (ref 1.4–7.7)
Neutrophils Relative %: 56.6 % (ref 43.0–77.0)
Platelets: 188 10*3/uL (ref 150.0–400.0)
RBC: 3.38 Mil/uL — ABNORMAL LOW (ref 3.87–5.11)
RDW: 17.4 % — ABNORMAL HIGH (ref 11.5–14.6)
WBC: 3 10*3/uL — ABNORMAL LOW (ref 4.5–10.5)

## 2012-12-06 LAB — HEPATIC FUNCTION PANEL
ALT: 29 U/L (ref 0–35)
AST: 66 U/L — ABNORMAL HIGH (ref 0–37)
Albumin: 3 g/dL — ABNORMAL LOW (ref 3.5–5.2)
Alkaline Phosphatase: 62 U/L (ref 39–117)
Bilirubin, Direct: 0.4 mg/dL — ABNORMAL HIGH (ref 0.0–0.3)
Total Bilirubin: 1.2 mg/dL (ref 0.3–1.2)
Total Protein: 7 g/dL (ref 6.0–8.3)

## 2012-12-06 LAB — BASIC METABOLIC PANEL
BUN: 23 mg/dL (ref 6–23)
CO2: 29 mEq/L (ref 19–32)
Calcium: 9.1 mg/dL (ref 8.4–10.5)
Chloride: 101 mEq/L (ref 96–112)
Creatinine, Ser: 1.1 mg/dL (ref 0.4–1.2)
GFR: 50.56 mL/min — ABNORMAL LOW (ref >60.00)
Glucose, Bld: 88 mg/dL (ref 70–99)
Potassium: 3.5 mEq/L (ref 3.5–5.1)
Sodium: 138 mEq/L (ref 135–145)

## 2012-12-06 LAB — TSH: TSH: 2.01 u[IU]/mL (ref 0.35–5.50)

## 2012-12-06 NOTE — Progress Notes (Signed)
Rowe Clack Date of Birth: 1929/03/13 Medical Record #[id number]  History of Present Illness: Ms. Math is seen back today for her 2 month check. She is seen for Dr. Johney Frame. She has an an extensive past medical history which includes atrial fib, PACs, PVCs, HTN, tachy brady, DVT, emphysema and prior AVR in July of 2011 which was associated with a very long hospitalization. She has most recently been having more shortness of breath. Seen in the ER in [place] and had a CT which was interpreted as a type I aortic dissection. She refused further treatment and referred herself back to Dr. Tyrone Sage. She actually has a significant size false aneurysm involving the ascending aorta instead of a dissection. The patient is not interested in further operative intervention due to her prolonged course from 2011. Other issue includes diastolic heart failure, general frailty, and remains on amiodarone. No longer on coumadin given her frailty.   Last seen here by Dr. Johney Frame in January. Felt to be doing ok. Saw Dr. Tyrone Sage earlier this month. Continues to require the higher doses of Lasix to manage her heart failure symptoms.   She comes in today. She is here with her husband. Doing ok. Seems to be holding her own. Weight is unchanged. He has tried cutting her Lasix back several times but after about 3 days, her swelling comes back. He has left her on a total of 80 mg a day. No chest pain. Not dizzy or lightheaded. BP is better at home.   Current Outpatient Prescriptions on File Prior to Visit  Medication Sig Dispense Refill  . albuterol (PROVENTIL HFA;VENTOLIN HFA) 108 (90 BASE) MCG/ACT inhaler Inhale 2 puffs into the lungs every 4 (four) hours as needed.      Marland Kitchen amiodarone (PACERONE) 200 MG tablet Take 0.5 tablets (100 mg total) by mouth daily.  45 tablet  3  . aspirin 81 MG tablet Take 81 mg by mouth daily.        . cilostazol (PLETAL) 100 MG tablet Take 100 mg by mouth 2 (two) times daily.        .  Cyanocobalamin (VITAMIN B-12 IJ) Inject as directed every 30 (thirty) days.        . Fe Fum-FePoly-Vit C-Vit B3 (INTEGRA PO) Take by mouth.      . fenofibrate 160 MG tablet Take 160 mg by mouth daily.        . fluticasone (VERAMYST) 27.5 MCG/SPRAY nasal spray Place 2 sprays into the nose daily.        . furosemide (LASIX) 40 MG tablet Take 1 tablet (40 mg total) by mouth 2 (two) times daily.  180 tablet  3  . lisinopril (PRINIVIL,ZESTRIL) 5 MG tablet Take 1 tablet (5 mg total) by mouth 2 (two) times daily.  180 tablet  3  . metoprolol (LOPRESSOR) 50 MG tablet Take 1 tablet (50 mg total) by mouth 2 (two) times daily.  180 tablet  3  . Nutritional Supplements (BOOST PLUS PO) Take by mouth as needed.        . potassium chloride SA (K-DUR,KLOR-CON) 20 MEQ tablet Take 1 tablet (20 mEq total) by mouth daily.  90 tablet  3  . silver sulfADIAZINE (SILVADENE) 1 % cream Apply topically daily. Apply as directed  400 g  3  . traMADol (ULTRAM) 50 MG tablet Take 50 mg by mouth every 6 (six) hours as needed.         No current facility-administered medications on file prior  to visit.    Allergies  Allergen Reactions  . Hydrocodone   . Rosuvastatin     REACTION: Reaction not known    Past Medical History  Diagnosis Date  . Atrial fibrillation   . PAC (premature atrial contraction)   . PVC (premature ventricular contraction)   . Hypertension   . Tachycardia-bradycardia syndrome   . DVT (deep venous thrombosis)   . Emphysema   . S/P aortic valve replacement     for severe symptomatic aortic stenosis by Dr Tyrone Sage  . S/P mastectomy   . Breast cancer   . Aortic dissection 07/15/12    Past Surgical History  Procedure Laterality Date  . Appendectomy    . Mastectomy    . Aortic valve replacement  03/29/2010     #21 pericardial tissue valve [employer] model #3300TFX serical number [id number]    History  Smoking status  . Former Smoker  Smokeless tobacco  . Not on file    History    Alcohol Use No    Family History  Problem Relation Age of Onset  . Breast cancer Mother   . Stroke Sister   . Diabetes Brother   . Heart attack Brother     Review of Systems: The review of systems is per the HPI.  All other systems were reviewed and are negative.  Physical Exam: BP 160/90  Pulse 70  Ht 5\' 4"  (1.626 m)  Wt 92 lb 1.9 oz (41.785 kg)  BMI 15.8 kg/m2 Patient is very pleasant and in no acute distress. She does appear chronically ill. She is quite thin. Skin is warm and dry. Color is normal.  HEENT is unremarkable. Normocephalic/atraumatic. PERRL. Sclera are nonicteric. Neck is supple. No masses. No JVD. Lungs are clear. Cardiac exam shows a regular rate and rhythm. Harsh outflow murmur noted. Abdomen is soft. Extremities are without edema. Gait and ROM are intact. No gross neurologic deficits noted.  LABORATORY DATA: Lab Results  Component Value Date   WBC 3.2* 09/17/2012   HGB 9.1* 09/17/2012   HCT 27.2* 09/17/2012   PLT 200.0 09/17/2012   GLUCOSE 126* 10/07/2012   ALT 28 10/18/2011   AST 51* 10/18/2011   NA 142 10/07/2012   K 3.5 10/07/2012   CL 102 10/07/2012   CREATININE 1.3* 10/07/2012   BUN 28* 10/07/2012   CO2 32 10/07/2012   TSH 2.79 10/18/2011   INR 3.1 07/11/2012   HGBA1C  Value: 5.4 (NOTE)                                                                       According to the ADA Clinical Practice Recommendations for 2011, when HbA1c is used as a screening test:   >=6.5%   Diagnostic of Diabetes Mellitus           (if abnormal result  is confirmed)  5.7-6.4%   Increased risk of developing Diabetes Mellitus  References:Diagnosis and Classification of Diabetes Mellitus,Diabetes Care,2011,34(Suppl 1):S62-S69 and Standards of Medical Care in         Diabetes - 2011,Diabetes Care,2011,34  (Suppl 1):S11-S61. 03/24/2010   Wt Readings from Last 3 Encounters:  12/06/12 92 lb 1.9 oz (41.785 kg)  11/14/12 92 lb (41.731 kg)  10/07/12  92 lb (41.731 kg)    Assessment / Plan: 1.  Diastolic heart failure - She continues to "hold her own". No change in her current regimen. Check her labs today. Encouraged activity as much as she can tolerate. I will see her back in about 3 months.  Her overall prognosis is remains poor.   2. False aneurysm - she is not interested in further surgical procedures.   3. HTN - BP has been better at home  4. PAF - remains on amiodarone. Needs follow up labs today.  I will see her back in 3 months.  Patient is agreeable to this plan and will call if any problems develop in the interim.   Rosalio Macadamia, RN, ANP-C [facility] [address] [place], [place]  [address]

## 2012-12-06 NOTE — Patient Instructions (Addendum)
I think you are doing ok  Stay on your current medicines  I will see you in 3 months  Lets check labs today  Call the [facility] office at [phone] if you have any questions, problems or concerns.

## 2013-02-13 ENCOUNTER — Encounter: Payer: Self-pay | Admitting: Cardiothoracic Surgery

## 2013-02-13 ENCOUNTER — Ambulatory Visit (INDEPENDENT_AMBULATORY_CARE_PROVIDER_SITE_OTHER): Payer: Medicare Other | Admitting: Cardiothoracic Surgery

## 2013-02-13 VITALS — BP 154/78 | HR 76 | Resp 16 | Ht 64.0 in | Wt 94.0 lb

## 2013-02-13 DIAGNOSIS — Z952 Presence of prosthetic heart valve: Secondary | ICD-10-CM

## 2013-02-13 DIAGNOSIS — Z954 Presence of other heart-valve replacement: Secondary | ICD-10-CM

## 2013-02-13 DIAGNOSIS — I729 Aneurysm of unspecified site: Secondary | ICD-10-CM

## 2013-02-13 NOTE — Progress Notes (Signed)
[address]       [place] [address]             [phone]                                     SUKANYA GOLDBLATT [facility] Medical Record #[id number] Date of Birth: 1929-08-14  Referring: Hillis Range, MD Primary Care: Marylen Ponto, MD  Chief Complaint:    Chief Complaint  Patient presents with  . Follow-up    3 MONTH F/U    History of Present Illness:     She comes in today for followup visit, she has improvement  of congestive heart failure with decreased pedal edema pedal edema. This has improved with increasing her Lasix dose to 80 mg once  a day.   No dyspnea or nocturnal dyspnea. She notes that when she decreased the dose to 60 mg twice a day the pedal edema returned. She feel and in juried her left foot several weeks ago but this has improved    Past Medical History  Diagnosis Date  . Atrial fibrillation   . PAC (premature atrial contraction)   . PVC (premature ventricular contraction)   . Hypertension   . Tachycardia-bradycardia syndrome   . DVT (deep venous thrombosis)   . Emphysema   . S/P aortic valve replacement     for severe symptomatic aortic stenosis by Dr Tyrone Sage  . S/P mastectomy   . Breast cancer   . Aortic dissection 07/15/12    Past Surgical History  Procedure Laterality Date  . Appendectomy    . Mastectomy    . Aortic valve replacement  03/29/2010     #21 pericardial tissue valve [employer] model #3300TFX serical number [id number]    Family History  Problem Relation Age of Onset  . Breast cancer Mother   . Stroke Sister   . Diabetes Brother   . Heart attack Brother     History   Social History  . Marital Status: Married    Spouse Name: N/A    Number of Children: N/A  . Years of Education: N/A   Occupational History  . Not on file.   Social History Main Topics  . Smoking status: Former Games developer  . Smokeless tobacco: Not on file  . Alcohol Use: No  . Drug Use: No  . Sexually Active: No    Other Topics Concern  . Not on file   Social History Narrative  . No narrative on file    History  Smoking status  . Former Smoker  Smokeless tobacco  . Not on file    History  Alcohol Use No     Allergies  Allergen Reactions  . Hydrocodone   . Rosuvastatin     REACTION: Reaction not known    Current Outpatient Prescriptions  Medication Sig Dispense Refill  . albuterol (PROVENTIL HFA;VENTOLIN HFA) 108 (90 BASE) MCG/ACT inhaler Inhale 2 puffs into the lungs every 4 (four) hours as needed.      Marland Kitchen amiodarone (PACERONE) 200 MG tablet Take 0.5 tablets (100 mg total) by mouth daily.  45 tablet  3  . aspirin 81 MG tablet Take 81 mg by mouth daily.        . cilostazol (PLETAL) 100 MG tablet Take 100 mg by mouth 2 (two) times daily.        Marland Kitchen  fenofibrate 160 MG tablet Take 160 mg by mouth daily.        . fluticasone (VERAMYST) 27.5 MCG/SPRAY nasal spray Place 2 sprays into the nose daily.        . furosemide (LASIX) 40 MG tablet Take 1 tablet (40 mg total) by mouth 2 (two) times daily.  180 tablet  3  . lisinopril (PRINIVIL,ZESTRIL) 5 MG tablet Take 1 tablet (5 mg total) by mouth 2 (two) times daily.  180 tablet  3  . metoprolol (LOPRESSOR) 50 MG tablet Take 1 tablet (50 mg total) by mouth 2 (two) times daily.  180 tablet  3  . Nutritional Supplements (BOOST PLUS PO) Take by mouth as needed.        . potassium chloride SA (K-DUR,KLOR-CON) 20 MEQ tablet Take 1 tablet (20 mEq total) by mouth daily.  90 tablet  3  . traMADol (ULTRAM) 50 MG tablet Take 50 mg by mouth every 6 (six) hours as needed.         No current facility-administered medications for this visit.       Review of Systems:     Cardiac Review of Systems: Y or N  Chest Pain [ n   ]  Resting SOB [ y  ] Exertional SOB  Cove.Etienne  ]  Orthopnea [ n ]   Pedal Edema [ n  ]    Palpitations [ n ] Syncope  [ n ]   Presyncope [  n ]  General Review of Systems: [Y] = yes [  ]=no Constitional: recent weight change [  ]; anorexia  [  ]; fatigue [  ]; nausea [  ]; night sweats [  ]; fever [  ]; or chills [  ];                                                                                                                                          Dental: poor dentition[  ]; Last Dentist visit:   Eye : blurred vision [ n ]; diplopia [   ]; vision changes [  ];  Amaurosis fugax[  ]; Resp: cough [  ];  wheezing[ n ];  hemoptysis[ n ]; shortness of breath[ y ]; paroxysmal nocturnal dyspnea[  ]; dyspnea on exertion[  ]; or orthopnea[  ];  GI:  gallstones[  ], vomiting[  ];  dysphagia[  ]; melena[  ];  hematochezia [  ]; heartburn[  ];   Hx of  Colonoscopy[  ]; GU: kidney stones [  ]; hematuria[  ];   dysuria [  ];  nocturia[  ];  history of     obstruction [  ];             Skin: rash, swelling[  ];, hair loss[  ];  peripheral edema[  ];  or itching[  ]; Musculosketetal: myalgias[  ];  joint swelling[  ];  joint erythema[  ];  joint pain[  ];  back pain[  ];  Heme/Lymph: bruising[  ];  bleeding[  ];  anemia[  ];  Neuro: TIA[  ];  headaches[  ];  stroke[  ];  vertigo[  ];  seizures[  ];   paresthesias[  ];  difficulty walking[  ];  Psych:depression[  ]; anxiety[  ];  Endocrine: diabetes[  ];  thyroid dysfunction[  ];  Immunizations: Flu [  ]; Pneumococcal[  ];  Other:  Physical Exam: BP 154/78  Pulse 76  Resp 16  Ht 5\' 4"  (1.626 m)  Wt 94 lb (42.638 kg)  BMI 16.13 kg/m2  SpO2 96%  General appearance: alert, cooperative, appears older than stated age, cachectic, fatigued and no distress Neurologic: intact Heart: regular rate and rhythm, S1, S2 normal, systolic ejection murmur 3/6 is evident, click, rub or gallop and regular rate and rhythm Lungs: clear to auscultation bilaterally and normal percussion bilaterally Abdomen: soft, non-tender; bowel sounds normal; no masses,  no organomegaly Extremities: extremities normal, atraumatic, no cyanosis or edema and no edema, redness or tenderness in the calves or thighs Wound:  sternum stable   Diagnostic Studies & Laboratory data:     Recent Radiology Findings:   No results found.  CT from [place]: CT ANGIOGRAPHY CHEST  Technique: Multidetector CT imaging of the chest using the standard protocol during bolus administration of intravenous contrast. Multiplanar reconstructed images including MIPs were obtained and reviewed to evaluate the vascular anatomy.  Contrast: 80 ml Isovue 370. Per technologist report, extravasation of approximately 20-25 ml of contrast occurred during the injection.  Comparison: None.  Findings: Contrast opacification of pulmonary arteries is good. No filling defects within either main pulmonary artery or their branches in either lung to suggest pulmonary embolism. However, there are filling defects within branches of the left inferior pulmonary vein. No other pulmonary venous filling defects are identified.  Prior aortic valve replacement. Dissection involving the descending thoracic aorta, with contrast opacification of the false lumen. The dissection extends to the arch, but does not involve the origins of the great vessels. There is no evidence of dissection involving the descending thoracic aorta or the visualized upper abdominal aorta.  Heart enlarged with biatrial enlargement and left ventricular hypertrophy. No pericardial effusion.  Severe bullous emphysematous changes throughout both lungs. Moderately large left pleural effusion. Dense consolidation in the left lower lobe. Mosaic attenuation throughout both lungs, likely a manifestation of the severe COPD. Minimal right pleural effusion.  Scattered mildly enlarged lymph nodes throughout the mediastinum, the largest a precarinal node approximating 2.0 x 1.4 cm. Visualized thyroid gland unremarkable.  Hyperdense liver without focal hepatic parenchymal abnormality. Atrophic left kidney. Remaining visualized upper abdomen unremarkable. Bone window images  demonstrate thoracic spondylosis, severe osteopenia, and exaggeration of the usual thoracic kyphosis.  IMPRESSION:  1. No evidence of pulmonary embolism. 2. Pulmonary venous thrombosis involving branches of the left inferior pulmonary vein.  3. Left lower lobe pneumonia. Moderately large left pleural effusion. 4. Severe COPD/emphysema. 5. Prior aortic valve replacement. Type A aortic dissection extending to the aortic arch, without involvement of the proximal great vessels or the descending thoracic aorta. 6. Hyperdense liver, likely a manifestation of amiodarone therapy 7. Atrophic left kidney. 8. Likely reactive mediastinal lymphadenopathy.   Recent Lab Findings: Lab Results  Component Value Date   WBC 3.0* 12/06/2012   HGB 10.5* 12/06/2012   HCT 30.7* 12/06/2012   PLT 188.0 12/06/2012  GLUCOSE 88 12/06/2012   ALT 29 12/06/2012   AST 66* 12/06/2012   NA 138 12/06/2012   K 3.5 12/06/2012   CL 101 12/06/2012   CREATININE 1.1 12/06/2012   BUN 23 12/06/2012   CO2 29 12/06/2012   TSH 2.01 12/06/2012   INR 3.1 07/11/2012   HGBA1C  Value: 5.4 (NOTE)                                                                       According to the ADA Clinical Practice Recommendations for 2011, when HbA1c is used as a screening test:   >=6.5%   Diagnostic of Diabetes Mellitus           (if abnormal result  is confirmed)  5.7-6.4%   Increased risk of developing Diabetes Mellitus  References:Diagnosis and Classification of Diabetes Mellitus,Diabetes Care,2011,34(Suppl 1):S62-S69 and Standards of Medical Care in         Diabetes - 2011,Diabetes Care,2011,34  (Suppl 1):S11-S61. 03/24/2010      Assessment / Plan:    She has no desire for any operative procedure Heart failure symptoms have improved but still needs high dose lasix. Will see in 3 months  Delight Ovens MD  Beeper [phone] Office [phone] 02/13/2013 5:35 PM

## 2013-03-07 ENCOUNTER — Ambulatory Visit: Payer: Medicare Other | Admitting: Nurse Practitioner

## 2013-05-22 ENCOUNTER — Ambulatory Visit (INDEPENDENT_AMBULATORY_CARE_PROVIDER_SITE_OTHER): Payer: Medicare Other | Admitting: Cardiothoracic Surgery

## 2013-05-22 ENCOUNTER — Encounter: Payer: Self-pay | Admitting: Cardiothoracic Surgery

## 2013-05-22 VITALS — BP 155/82 | HR 68 | Resp 20 | Ht 63.0 in | Wt 93.0 lb

## 2013-05-22 DIAGNOSIS — I729 Aneurysm of unspecified site: Secondary | ICD-10-CM

## 2013-05-22 NOTE — Progress Notes (Signed)
[address]       [place] [address]             [phone]          Savannah Roberts [facility] Medical Record #[id number] Date of Birth: Jul 10, 1929  Referring: Dr Johney Frame Primary Care: Marylen Ponto, MD  Chief Complaint:    Chief Complaint  Patient presents with  . Follow-up    3 month f/u, surveillance of false aneurysm    History of Present Illness:     She comes in today for followup visit, she has improvement  of congestive heart failure with no  pedal edema pedal edema. Overall since she was seen 3 months ago she seems improved. Less fatigued appearing and more talkative. However she remains very frail and her physical ability.  Past Medical History  Diagnosis Date  . Atrial fibrillation   . PAC (premature atrial contraction)   . PVC (premature ventricular contraction)   . Hypertension   . Tachycardia-bradycardia syndrome   . DVT (deep venous thrombosis)   . Emphysema   . S/P aortic valve replacement     for severe symptomatic aortic stenosis by Dr Tyrone Sage  . S/P mastectomy   . Breast cancer   . Aortic dissection 07/15/12    Past Surgical History  Procedure Laterality Date  . Appendectomy    . Mastectomy    . Aortic valve replacement  03/29/2010     #21 pericardial tissue valve [employer] model #3300TFX serical number [id number]    Family History  Problem Relation Age of Onset  . Breast cancer Mother   . Stroke Sister   . Diabetes Brother   . Heart attack Brother     History   Social History  . Marital Status: Married    Spouse Name: N/A    Number of Children: N/A  . Years of Education: N/A   Occupational History  . Not on file.   Social History Main Topics  . Smoking status: Former Games developer  . Smokeless tobacco: Not on file  . Alcohol Use: No  . Drug Use: No  . Sexual Activity: No   Other Topics Concern  . Not on file   Social History Narrative  . No narrative on file    History  Smoking  status  . Former Smoker  Smokeless tobacco  . Not on file    History  Alcohol Use No     Allergies  Allergen Reactions  . Hydrocodone   . Rosuvastatin     REACTION: Reaction not known    Current Outpatient Prescriptions  Medication Sig Dispense Refill  . albuterol (PROVENTIL HFA;VENTOLIN HFA) 108 (90 BASE) MCG/ACT inhaler Inhale 2 puffs into the lungs every 4 (four) hours as needed.      Marland Kitchen amiodarone (PACERONE) 200 MG tablet Take 0.5 tablets (100 mg total) by mouth daily.  45 tablet  3  . aspirin 81 MG tablet Take 81 mg by mouth daily.        . cilostazol (PLETAL) 100 MG tablet Take 100 mg by mouth 2 (two) times daily.        . fenofibrate 160 MG tablet Take 160 mg by mouth daily.        . fluticasone (VERAMYST) 27.5 MCG/SPRAY nasal spray Place 2 sprays into the nose daily.        . furosemide (LASIX) 40 MG tablet Take 1 tablet (40 mg  total) by mouth 2 (two) times daily.  180 tablet  3  . lisinopril (PRINIVIL,ZESTRIL) 5 MG tablet Take 1 tablet (5 mg total) by mouth 2 (two) times daily.  180 tablet  3  . metoprolol (LOPRESSOR) 50 MG tablet Take 1 tablet (50 mg total) by mouth 2 (two) times daily.  180 tablet  3  . Nutritional Supplements (BOOST PLUS PO) Take by mouth as needed.        . potassium chloride SA (K-DUR,KLOR-CON) 20 MEQ tablet Take 1 tablet (20 mEq total) by mouth daily.  90 tablet  3  . traMADol (ULTRAM) 50 MG tablet Take 50 mg by mouth every 6 (six) hours as needed.         No current facility-administered medications for this visit.       Review of Systems:     Cardiac Review of Systems: Y or N  Chest Pain [ n   ]  Resting SOB [ y  ] Exertional SOB  Cove.Etienne  ]  Orthopnea [ y]   Pedal Edema [ n  ]    Palpitations [ n ] Syncope  [ n ]   Presyncope [  n ]  General Review of Systems: [Y] = yes [  ]=no Constitional: recent weight change [  ]; anorexia [  ]; fatigue [  ]; nausea [  ]; night sweats [  ]; fever [  ]; or chills [  ];                                                                                                                                           Dental: poor dentition[  ]; Last Dentist visit:   Eye : blurred vision [ n ]; diplopia [   ]; vision changes [  ];  Amaurosis fugax[  ]; Resp: cough [  ];  wheezing[ n ];  hemoptysis[ n ]; shortness of breath[ y ]; paroxysmal nocturnal dyspnea[  ]; dyspnea on exertion[  ]; or orthopnea[  ];  GI:  gallstones[  ], vomiting[  ];  dysphagia[  ]; melena[  ];  hematochezia [  ]; heartburn[  ];   Hx of  Colonoscopy[  ]; GU: kidney stones [  ]; hematuria[  ];   dysuria [  ];  nocturia[  ];  history of     obstruction [  ];             Skin: rash, swelling[  ];, hair loss[  ];  peripheral edema[  ];  or itching[  ]; Musculosketetal: myalgias[  ];  joint swelling[  ];  joint erythema[  ];  joint pain[  ];  back pain[  ];  Heme/Lymph: bruising[  ];  bleeding[  ];  anemia[  ];  Neuro: TIA[  ];  headaches[  ];  stroke[  ];  vertigo[  ];  seizures[  ];   paresthesias[  ];  difficulty walking[  ];  Psych:depression[  ]; anxiety[  ];  Endocrine: diabetes[  ];  thyroid dysfunction[  ];  Immunizations: Flu [  ]; Pneumococcal[  ];  Other:  Physical Exam: BP 155/82  Pulse 68  Resp 20  Ht 5\' 3"  (1.6 m)  Wt 93 lb (42.185 kg)  BMI 16.48 kg/m2  SpO2 96%  General appearance: alert, cooperative, appears older than stated age, cachectic, fatigued and no distress Neurologic: intact Heart: regular rate and rhythm, S1, S2 normal, systolic ejection murmur 3/6 is evident, click, rub or gallop and regular rate and rhythm Lungs: clear to auscultation bilaterally and normal percussion bilaterally Abdomen: soft, non-tender; bowel sounds normal; no masses,  no organomegaly Extremities: extremities normal, atraumatic, no cyanosis or edema and no edema, redness or tenderness in the calves or thighs Wound: sternum stable   Diagnostic Studies & Laboratory data:     Recent Radiology Findings:   No results found.  CT from  [place]: CT ANGIOGRAPHY CHEST  Technique: Multidetector CT imaging of the chest using the standard protocol during bolus administration of intravenous contrast. Multiplanar reconstructed images including MIPs were obtained and reviewed to evaluate the vascular anatomy.  Contrast: 80 ml Isovue 370. Per technologist report, extravasation of approximately 20-25 ml of contrast occurred during the injection.  Comparison: None.  Findings: Contrast opacification of pulmonary arteries is good. No filling defects within either main pulmonary artery or their branches in either lung to suggest pulmonary embolism. However, there are filling defects within branches of the left inferior pulmonary vein. No other pulmonary venous filling defects are identified.  Prior aortic valve replacement. Dissection involving the descending thoracic aorta, with contrast opacification of the false lumen. The dissection extends to the arch, but does not involve the origins of the great vessels. There is no evidence of dissection involving the descending thoracic aorta or the visualized upper abdominal aorta.  Heart enlarged with biatrial enlargement and left ventricular hypertrophy. No pericardial effusion.  Severe bullous emphysematous changes throughout both lungs. Moderately large left pleural effusion. Dense consolidation in the left lower lobe. Mosaic attenuation throughout both lungs, likely a manifestation of the severe COPD. Minimal right pleural effusion.  Scattered mildly enlarged lymph nodes throughout the mediastinum, the largest a precarinal node approximating 2.0 x 1.4 cm. Visualized thyroid gland unremarkable.  Hyperdense liver without focal hepatic parenchymal abnormality. Atrophic left kidney. Remaining visualized upper abdomen unremarkable. Bone window images demonstrate thoracic spondylosis, severe osteopenia, and exaggeration of the usual thoracic kyphosis.  IMPRESSION:  1. No  evidence of pulmonary embolism. 2. Pulmonary venous thrombosis involving branches of the left inferior pulmonary vein.  3. Left lower lobe pneumonia. Moderately large left pleural effusion. 4. Severe COPD/emphysema. 5. Prior aortic valve replacement. Type A aortic dissection extending to the aortic arch, without involvement of the proximal great vessels or the descending thoracic aorta. 6. Hyperdense liver, likely a manifestation of amiodarone therapy 7. Atrophic left kidney. 8. Likely reactive mediastinal lymphadenopathy.   Recent Lab Findings: Lab Results  Component Value Date   WBC 3.0* 12/06/2012   HGB 10.5* 12/06/2012   HCT 30.7* 12/06/2012   PLT 188.0 12/06/2012   GLUCOSE 88 12/06/2012   ALT 29 12/06/2012   AST 66* 12/06/2012   NA 138 12/06/2012   K 3.5 12/06/2012   CL 101 12/06/2012   CREATININE 1.1 12/06/2012   BUN 23 12/06/2012   CO2 29 12/06/2012   TSH 2.01 12/06/2012  INR 3.1 07/11/2012   HGBA1C  Value: 5.4 (NOTE)                                                                       According to the ADA Clinical Practice Recommendations for 2011, when HbA1c is used as a screening test:   >=6.5%   Diagnostic of Diabetes Mellitus           (if abnormal result  is confirmed)  5.7-6.4%   Increased risk of developing Diabetes Mellitus  References:Diagnosis and Classification of Diabetes Mellitus,Diabetes Care,2011,34(Suppl 1):S62-S69 and Standards of Medical Care in         Diabetes - 2011,Diabetes Care,2011,34  (Suppl 1):S11-S61. 03/24/2010      Assessment / Plan:   She has no desire for any operative procedure Heart failure symptoms have improved but still needs  lasix. Will see in 3 months  Delight Ovens MD  Beeper [phone] Office [phone] 05/22/2013 12:33 PM

## 2013-05-23 ENCOUNTER — Other Ambulatory Visit: Payer: Self-pay | Admitting: Nurse Practitioner

## 2013-05-23 MED ORDER — CILOSTAZOL 100 MG PO TABS: 100.0000 mg | ORAL_TABLET | Freq: Two times a day (BID) | ORAL | Status: AC

## 2013-06-02 ENCOUNTER — Encounter: Payer: Self-pay | Admitting: Internal Medicine

## 2013-06-02 ENCOUNTER — Ambulatory Visit (INDEPENDENT_AMBULATORY_CARE_PROVIDER_SITE_OTHER): Payer: Medicare Other | Admitting: Internal Medicine

## 2013-06-02 VITALS — BP 110/68 | HR 69 | Ht 63.0 in | Wt 93.0 lb

## 2013-06-02 DIAGNOSIS — I359 Nonrheumatic aortic valve disorder, unspecified: Secondary | ICD-10-CM

## 2013-06-02 DIAGNOSIS — I10 Essential (primary) hypertension: Secondary | ICD-10-CM

## 2013-06-02 DIAGNOSIS — I4891 Unspecified atrial fibrillation: Secondary | ICD-10-CM

## 2013-06-02 DIAGNOSIS — I5031 Acute diastolic (congestive) heart failure: Secondary | ICD-10-CM

## 2013-06-02 LAB — HEPATIC FUNCTION PANEL
AST: 50 U/L — ABNORMAL HIGH (ref 0–37)
Albumin: 3 g/dL — ABNORMAL LOW (ref 3.5–5.2)
Alkaline Phosphatase: 47 U/L (ref 39–117)
Total Bilirubin: 1 mg/dL (ref 0.3–1.2)

## 2013-06-02 LAB — BASIC METABOLIC PANEL
BUN: 27 mg/dL — ABNORMAL HIGH (ref 6–23)
Calcium: 9.3 mg/dL (ref 8.4–10.5)
Creatinine, Ser: 1.2 mg/dL (ref 0.4–1.2)
GFR: 44.81 mL/min — ABNORMAL LOW (ref >60.00)
Glucose, Bld: 98 mg/dL (ref 70–99)
Potassium: 3.6 mEq/L (ref 3.5–5.1)

## 2013-06-02 LAB — TSH: TSH: 2.09 u[IU]/mL (ref 0.35–5.50)

## 2013-06-02 MED ORDER — METOPROLOL TARTRATE 50 MG PO TABS: 50.0000 mg | ORAL_TABLET | Freq: Two times a day (BID) | ORAL | Status: DC

## 2013-06-02 MED ORDER — LISINOPRIL 5 MG PO TABS: 5.0000 mg | ORAL_TABLET | Freq: Two times a day (BID) | ORAL | Status: AC

## 2013-06-02 MED ORDER — ALBUTEROL SULFATE HFA 108 (90 BASE) MCG/ACT IN AERS: 2.0000 | INHALATION_SPRAY | RESPIRATORY_TRACT | Status: DC | PRN

## 2013-06-02 MED ORDER — FENOFIBRATE 160 MG PO TABS: 160.0000 mg | ORAL_TABLET | Freq: Every day | ORAL | Status: AC

## 2013-06-02 MED ORDER — POTASSIUM CHLORIDE CRYS ER 20 MEQ PO TBCR: 20.0000 meq | EXTENDED_RELEASE_TABLET | Freq: Every day | ORAL | Status: AC

## 2013-06-02 NOTE — Patient Instructions (Signed)
Your physician wants you to follow-up in: 6 months with Dr Jacquiline Doe will receive a reminder letter in the mail two months in advance. If you don't receive a letter, please call our office to schedule the follow-up appointment.  Your physician recommends that you return for lab work today BMP/Liver/TSH/T4

## 2013-06-02 NOTE — Progress Notes (Signed)
PCP: Marylen Ponto, MD  Savannah Roberts is a 79 y.o. female who presents today for routine cardiology followup. She is stable, without significant changes recently.  Her SOB is stable, though edema is improved.  She has occasional nocturnal dyspnea. Today, she denies symptoms of palpitations, chest pain, dizziness, presyncope, or syncope.  The patient is otherwise without complaint today.   Past Medical History  Diagnosis Date  . Atrial fibrillation   . PAC (premature atrial contraction)   . PVC (premature ventricular contraction)   . Hypertension   . Tachycardia-bradycardia syndrome   . DVT (deep venous thrombosis)   . Emphysema   . S/P aortic valve replacement     for severe symptomatic aortic stenosis by Dr Tyrone Sage  . S/P mastectomy   . Breast cancer   . Aortic dissection 07/15/12   Past Surgical History  Procedure Laterality Date  . Appendectomy    . Mastectomy    . Aortic valve replacement  03/29/2010     #21 pericardial tissue valve [employer] model #3300TFX serical number [id number]    Current Outpatient Prescriptions  Medication Sig Dispense Refill  . albuterol (PROVENTIL HFA;VENTOLIN HFA) 108 (90 BASE) MCG/ACT inhaler Inhale 2 puffs into the lungs every 4 (four) hours as needed.      Marland Kitchen amiodarone (PACERONE) 200 MG tablet Take 0.5 tablets (100 mg total) by mouth daily.  45 tablet  3  . aspirin 81 MG tablet Take 81 mg by mouth daily.        . cilostazol (PLETAL) 100 MG tablet Take 1 tablet (100 mg total) by mouth 2 (two) times daily.  180 tablet  3  . fenofibrate 160 MG tablet Take 160 mg by mouth daily.        . furosemide (LASIX) 40 MG tablet Take 1 tablet (40 mg total) by mouth 2 (two) times daily.  180 tablet  3  . lisinopril (PRINIVIL,ZESTRIL) 5 MG tablet Take 1 tablet (5 mg total) by mouth 2 (two) times daily.  180 tablet  3  . metoprolol (LOPRESSOR) 50 MG tablet Take 1 tablet (50 mg total) by mouth 2 (two) times daily.  180 tablet  3  . Nutritional Supplements  (BOOST PLUS PO) Take by mouth as needed.        . potassium chloride SA (K-DUR,KLOR-CON) 20 MEQ tablet Take 1 tablet (20 mEq total) by mouth daily.  90 tablet  3  . traMADol (ULTRAM) 50 MG tablet Take 50 mg by mouth every 6 (six) hours as needed.         No current facility-administered medications for this visit.    Physical Exam: Filed Vitals:   06/02/13 1141  BP: 110/68  Pulse: 69  Height: 5\' 3"  (1.6 m)  Weight: 93 lb (42.185 kg)    GEN- The patient is frail and elderly appearing, alert and oriented x 3 today.   Head- normocephalic, atraumatic Eyes-  Sclera clear, conjunctiva pink Ears- hearing intact Oropharynx- clear Lungs-decreased BS at the bases, normal work of breathing Heart- Regular rate and rhythm, 4/6 SEM LLSB GI- soft, NT, ND, + BS Extremities- no clubbing, cyanosis, trace edema  ekg today reveals sinus rhythm 69 bpm,  LVH  Assessment and Plan:  1. afib Well controlled With amiodarone Not felt to be a candidate for coumadin Check LFTs/TFTs today  2. HTN Stable No change required today  3. Diastolic dysfunction Stable No change required today BMET today  Return in 6 months

## 2013-08-21 ENCOUNTER — Encounter: Payer: Self-pay | Admitting: Cardiothoracic Surgery

## 2013-08-21 ENCOUNTER — Ambulatory Visit (INDEPENDENT_AMBULATORY_CARE_PROVIDER_SITE_OTHER): Payer: Medicare Other | Admitting: Cardiothoracic Surgery

## 2013-08-21 VITALS — BP 140/76 | HR 73 | Resp 20 | Ht 63.0 in | Wt 97.0 lb

## 2013-08-21 DIAGNOSIS — I359 Nonrheumatic aortic valve disorder, unspecified: Secondary | ICD-10-CM

## 2013-08-21 DIAGNOSIS — I729 Aneurysm of unspecified site: Secondary | ICD-10-CM

## 2013-08-21 DIAGNOSIS — I509 Heart failure, unspecified: Secondary | ICD-10-CM | POA: Insufficient documentation

## 2013-08-21 DIAGNOSIS — I4891 Unspecified atrial fibrillation: Secondary | ICD-10-CM

## 2013-08-21 NOTE — Progress Notes (Signed)
[address]       [place] [address]             [phone]          Savannah Roberts [facility] Medical Record #[id number] Date of Birth: [birth year]  Referring: Dr Johney Frame Primary Care: Marylen Ponto, MD  Chief Complaint:    Chief Complaint  Patient presents with  . Follow-up    3 month f/u on false aneurysm    History of Present Illness:     She comes in today for followup visit, she has unchanged  of congestive heart failure with no  pedal edema pedal edema. She notes occasional episode of sob at night, sitts up for a few min and uses inhaler with improvement does not occur during the day.  Overall since she was seen 3 months ago she seems stable.  However she remains very frail and her physical ability is very limited.  Past Medical History  Diagnosis Date  . Atrial fibrillation   . PAC (premature atrial contraction)   . PVC (premature ventricular contraction)   . Hypertension   . Tachycardia-bradycardia syndrome   . DVT (deep venous thrombosis)   . Emphysema   . S/P aortic valve replacement     for severe symptomatic aortic stenosis by Dr Tyrone Sage  . S/P mastectomy   . Breast cancer   . Aortic dissection 07/15/12    Past Surgical History  Procedure Laterality Date  . Appendectomy    . Mastectomy    . Aortic valve replacement  03/29/2010     #21 pericardial tissue valve [employer] model #3300TFX serical number [id number]    Family History  Problem Relation Age of Onset  . Breast cancer Mother   . Stroke Sister   . Diabetes Brother   . Heart attack Brother     History   Social History  . Marital Status: Married    Spouse Name: N/A    Number of Children: N/A  . Years of Education: N/A   Occupational History  . Not on file.   Social History Main Topics  . Smoking status: Former Games developer  . Smokeless tobacco: Not on file  . Alcohol Use: No  . Drug Use: No  . Sexual Activity: No   Other Topics Concern  . Not  on file   Social History Narrative  . No narrative on file    History  Smoking status  . Former Smoker  Smokeless tobacco  . Not on file    History  Alcohol Use No     Allergies  Allergen Reactions  . Hydrocodone   . Rosuvastatin     REACTION: Reaction not known    Current Outpatient Prescriptions  Medication Sig Dispense Refill  . albuterol (PROVENTIL HFA;VENTOLIN HFA) 108 (90 BASE) MCG/ACT inhaler Inhale 2 puffs into the lungs every 4 (four) hours as needed.  2 Inhaler  3  . amiodarone (PACERONE) 200 MG tablet Take 0.5 tablets (100 mg total) by mouth daily.  45 tablet  3  . aspirin 81 MG tablet Take 81 mg by mouth daily.        . cilostazol (PLETAL) 100 MG tablet Take 1 tablet (100 mg total) by mouth 2 (two) times daily.  180 tablet  3  . fenofibrate 160 MG tablet Take 1 tablet (160 mg total) by mouth daily.  90 tablet  3  . furosemide (LASIX) 40  MG tablet Take 1 tablet (40 mg total) by mouth 2 (two) times daily.  180 tablet  3  . lisinopril (PRINIVIL,ZESTRIL) 5 MG tablet Take 1 tablet (5 mg total) by mouth 2 (two) times daily.  180 tablet  3  . metoprolol (LOPRESSOR) 50 MG tablet Take 1 tablet (50 mg total) by mouth 2 (two) times daily.  180 tablet  3  . Nutritional Supplements (BOOST PLUS PO) Take by mouth as needed.        . potassium chloride SA (K-DUR,KLOR-CON) 20 MEQ tablet Take 1 tablet (20 mEq total) by mouth daily.  90 tablet  3  . traMADol (ULTRAM) 50 MG tablet Take 50 mg by mouth every 6 (six) hours as needed.         No current facility-administered medications for this visit.       Review of Systems:     Cardiac Review of Systems: Y or N  Chest Pain [ n   ]  Resting SOB [ y  ] Exertional SOB  Cove.Etienne  ]  Orthopnea [ y]   Pedal Edema [ n  ]    Palpitations [ n ] Syncope  [ n ]   Presyncope [  n ]  General Review of Systems: [Y] = yes [  ]=no Constitional: recent weight change [  ]; anorexia [  ]; fatigue [  ]; nausea [  ]; night sweats [  ]; fever [  ]; or  chills [  ];                                                                                                                                          Dental: poor dentition[  ]; Last Dentist visit:   Eye : blurred vision [ n ]; diplopia [   ]; vision changes [  ];  Amaurosis fugax[  ]; Resp: cough [  ];  wheezing[y ];  hemoptysis[ n ]; shortness of breath[ y ]; paroxysmal nocturnal dyspnea[  ]; dyspnea on exertion[  ]; or orthopnea[ y ];  GI:  gallstones[  ], vomiting[  ];  dysphagia[  ]; melena[  ];  hematochezia [  ]; heartburn[  ];   Hx of  Colonoscopy[  ]; GU: kidney stones [  ]; hematuria[  ];   dysuria [  ];  nocturia[  ];  history of     obstruction [  ];             Skin: rash, swelling[  ];, hair loss[  ];  peripheral edema[  ];  or itching[  ]; Musculosketetal: myalgias[  ];  joint swelling[  ];  joint erythema[  ];  joint pain[  ];  back pain[  ];  Heme/Lymph: bruising[  ];  bleeding[  ];  anemia[  ];  Neuro: TIA[  ];  headaches[  ];  stroke[  ];  vertigo[  ];  seizures[  ];   paresthesias[  ];  difficulty walking[  ];  Psych:depression[  ]; anxiety[  ];  Endocrine: diabetes[  ];  thyroid dysfunction[  ];  Immunizations: Flu [  ]; Pneumococcal[  ];  Other:  Physical Exam: BP 140/76  Pulse 73  Resp 20  Ht 5\' 3"  (1.6 m)  Wt 97 lb (43.999 kg)  BMI 17.19 kg/m2  SpO2 96%  General appearance: alert, cooperative, appears older than stated age, cachectic, fatigued and no distress Neurologic: intact Heart: regular rate and rhythm, S1, S2 normal, systolic ejection murmur 3/6 is evident, click, rub or gallop and regular rate and rhythm, murmur unchanged Lungs: clear to auscultation bilaterally and normal percussion bilaterally Abdomen: soft, non-tender; bowel sounds normal; no masses,  no organomegaly Extremities: extremities normal, atraumatic, no cyanosis or edema and no edema, redness or tenderness in the calves or thighs Wound: sternum stable, feet with out any ulcers or open  wounds   Diagnostic Studies & Laboratory data:     Recent Radiology Findings:   No results found.  CT from [place]: CT ANGIOGRAPHY CHEST  Technique: Multidetector CT imaging of the chest using the standard protocol during bolus administration of intravenous contrast. Multiplanar reconstructed images including MIPs were obtained and reviewed to evaluate the vascular anatomy.  Contrast: 80 ml Isovue 370. Per technologist report, extravasation of approximately 20-25 ml of contrast occurred during the injection.  Comparison: None.  Findings: Contrast opacification of pulmonary arteries is good. No filling defects within either main pulmonary artery or their branches in either lung to suggest pulmonary embolism. However, there are filling defects within branches of the left inferior pulmonary vein. No other pulmonary venous filling defects are identified.  Prior aortic valve replacement. Dissection involving the descending thoracic aorta, with contrast opacification of the false lumen. The dissection extends to the arch, but does not involve the origins of the great vessels. There is no evidence of dissection involving the descending thoracic aorta or the visualized upper abdominal aorta.  Heart enlarged with biatrial enlargement and left ventricular hypertrophy. No pericardial effusion.  Severe bullous emphysematous changes throughout both lungs. Moderately large left pleural effusion. Dense consolidation in the left lower lobe. Mosaic attenuation throughout both lungs, likely a manifestation of the severe COPD. Minimal right pleural effusion.  Scattered mildly enlarged lymph nodes throughout the mediastinum, the largest a precarinal node approximating 2.0 x 1.4 cm. Visualized thyroid gland unremarkable.  Hyperdense liver without focal hepatic parenchymal abnormality. Atrophic left kidney. Remaining visualized upper abdomen unremarkable. Bone window images demonstrate  thoracic spondylosis, severe osteopenia, and exaggeration of the usual thoracic kyphosis.  IMPRESSION:  1. No evidence of pulmonary embolism. 2. Pulmonary venous thrombosis involving branches of the left inferior pulmonary vein.  3. Left lower lobe pneumonia. Moderately large left pleural effusion. 4. Severe COPD/emphysema. 5. Prior aortic valve replacement. Type A aortic dissection extending to the aortic arch, without involvement of the proximal great vessels or the descending thoracic aorta. 6. Hyperdense liver, likely a manifestation of amiodarone therapy 7. Atrophic left kidney. 8. Likely reactive mediastinal lymphadenopathy.   Recent Lab Findings: Lab Results  Component Value Date   WBC 3.0* 12/06/2012   HGB 10.5* 12/06/2012   HCT 30.7* 12/06/2012   PLT 188.0 12/06/2012   GLUCOSE 98 06/02/2013   ALT 21 06/02/2013   AST 50* 06/02/2013   NA 141 06/02/2013   K 3.6 06/02/2013   CL 105 06/02/2013   CREATININE 1.2 06/02/2013   BUN  27* 06/02/2013   CO2 32 06/02/2013   TSH 2.09 06/02/2013   INR 3.1 07/11/2012   HGBA1C  Value: 5.4 (NOTE)                                                                       According to the ADA Clinical Practice Recommendations for 2011, when HbA1c is used as a screening test:   >=6.5%   Diagnostic of Diabetes Mellitus           (if abnormal result  is confirmed)  5.7-6.4%   Increased risk of developing Diabetes Mellitus  References:Diagnosis and Classification of Diabetes Mellitus,Diabetes Care,2011,34(Suppl 1):S62-S69 and Standards of Medical Care in         Diabetes - 2011,Diabetes Care,2011,34  (Suppl 1):S11-S61. 03/24/2010      Assessment / Plan:   She has no desire for any operative procedure Heart failure symptoms are stable  still needs  Lasix Will see in 6 months seeing cardiology in 3 months  Delight Ovens MD  Beeper [phone] Office [phone] 08/21/2013 11:47 AM

## 2013-12-26 ENCOUNTER — Encounter: Payer: Self-pay | Admitting: Internal Medicine

## 2013-12-26 ENCOUNTER — Ambulatory Visit (INDEPENDENT_AMBULATORY_CARE_PROVIDER_SITE_OTHER): Payer: Medicare Other | Admitting: Internal Medicine

## 2013-12-26 VITALS — BP 170/90 | HR 72 | Ht 63.0 in | Wt 92.0 lb

## 2013-12-26 DIAGNOSIS — I5032 Chronic diastolic (congestive) heart failure: Secondary | ICD-10-CM

## 2013-12-26 DIAGNOSIS — I10 Essential (primary) hypertension: Secondary | ICD-10-CM

## 2013-12-26 DIAGNOSIS — I5031 Acute diastolic (congestive) heart failure: Secondary | ICD-10-CM

## 2013-12-26 DIAGNOSIS — R0602 Shortness of breath: Secondary | ICD-10-CM

## 2013-12-26 DIAGNOSIS — I4891 Unspecified atrial fibrillation: Secondary | ICD-10-CM

## 2013-12-26 DIAGNOSIS — I359 Nonrheumatic aortic valve disorder, unspecified: Secondary | ICD-10-CM

## 2013-12-26 LAB — HEPATIC FUNCTION PANEL
ALK PHOS: 52 U/L (ref 39–117)
ALT: 13 U/L (ref 0–35)
AST: 36 U/L (ref 0–37)
Albumin: 2.9 g/dL — ABNORMAL LOW (ref 3.5–5.2)
Bilirubin, Direct: 0.3 mg/dL (ref 0.0–0.3)
Total Bilirubin: 1 mg/dL (ref 0.3–1.2)
Total Protein: 6.5 g/dL (ref 6.0–8.3)

## 2013-12-26 LAB — T4, FREE: Free T4: 1.37 ng/dL (ref 0.60–1.60)

## 2013-12-26 LAB — TSH: TSH: 1.35 u[IU]/mL (ref 0.35–5.50)

## 2013-12-26 NOTE — Patient Instructions (Signed)
Your physician recommends that you schedule a follow-up appointment in: 4 weeks with Truitt Merle, NP  Your physician wants you to follow-up in: 6 months with Dr Vallery Ridge will receive a reminder letter in the mail two months in advance. If you don't receive a letter, please call our office to schedule the follow-up appointment.  Your physician has requested that you have an echocardiogram. Echocardiography is a painless test that uses sound waves to create images of your heart. It provides your doctor with information about the size and shape of your heart and how well your heart's chambers and valves are working. This procedure takes approximately one hour. There are no restrictions for this procedure.  Your physician has recommended that you have a pulmonary function test. Pulmonary Function Tests are a group of tests that measure how well air moves in and out of your lungs.  Your physician recommends that you return for lab work today: liver/tsh/t4

## 2013-12-28 DIAGNOSIS — R0602 Shortness of breath: Secondary | ICD-10-CM | POA: Insufficient documentation

## 2013-12-28 NOTE — Progress Notes (Signed)
PCP: Ronita Hipps, MD  Savannah Roberts is a 80 y.o. female who presents today for routine cardiology followup. She continues to have SOB and also occasional nocturnal dyspnea. Today, she denies symptoms of palpitations, chest pain, dizziness, presyncope, or syncope.  The patient is otherwise without complaint today.   Past Medical History  Diagnosis Date  . Atrial fibrillation   . PAC (premature atrial contraction)   . PVC (premature ventricular contraction)   . Hypertension   . Tachycardia-bradycardia syndrome   . DVT (deep venous thrombosis)   . Emphysema   . S/P aortic valve replacement     for severe symptomatic aortic stenosis by Dr Servando Snare  . S/P mastectomy   . Breast cancer   . Aortic dissection 07/15/12   Past Surgical History  Procedure Laterality Date  . Appendectomy    . Mastectomy    . Aortic valve replacement  03/29/2010     #21 pericardial tissue valve [employer] model #3300TFX serical number [id number]    Current Outpatient Prescriptions  Medication Sig Dispense Refill  . amiodarone (PACERONE) 200 MG tablet Take 0.5 tablets (100 mg total) by mouth daily.  45 tablet  3  . aspirin 81 MG tablet Take 81 mg by mouth daily.        . cilostazol (PLETAL) 100 MG tablet Take 1 tablet (100 mg total) by mouth 2 (two) times daily.  180 tablet  3  . fenofibrate 160 MG tablet Take 1 tablet (160 mg total) by mouth daily.  90 tablet  3  . ferrous fumarate (HEMOCYTE - 106 MG FE) 325 (106 FE) MG TABS tablet Take 1 tablet by mouth daily.      . furosemide (LASIX) 40 MG tablet Take 1 tablet (40 mg total) by mouth 2 (two) times daily.  180 tablet  3  . lisinopril (PRINIVIL,ZESTRIL) 5 MG tablet Take 1 tablet (5 mg total) by mouth 2 (two) times daily.  180 tablet  3  . metoprolol (LOPRESSOR) 50 MG tablet Take 1 tablet (50 mg total) by mouth 2 (two) times daily.  180 tablet  3  . Nutritional Supplements (BOOST PLUS PO) Take by mouth as needed.        . potassium chloride SA  (K-DUR,KLOR-CON) 20 MEQ tablet Take 1 tablet (20 mEq total) by mouth daily.  90 tablet  3  . traMADol (ULTRAM) 50 MG tablet Take 50 mg by mouth every 6 (six) hours as needed.         No current facility-administered medications for this visit.    Physical Exam: Filed Vitals:   12/26/13 1023  BP: 170/90  Pulse: 72  Height: 5\' 3"  (1.6 m)  Weight: 92 lb (41.731 kg)    GEN- The patient is frail and elderly appearing, alert and oriented x 3 today.   Head- normocephalic, atraumatic Eyes-  Sclera clear, conjunctiva pink Ears- hearing intact Oropharynx- clear Lungs-decreased BS at the bases, normal work of breathing Heart- Regular rate and rhythm, 4/6 SEM LLSB GI- soft, NT, ND, + BS Extremities- no clubbing, cyanosis, trace edema  ekg today reveals sinus rhythm 72 bpm,  LVH  Assessment and Plan:  1. afib Well controlled with amiodarone--> given shortness of breath, I recommended that we consider stopping the medicine.  The patient and her husband are clear that they do not wish to stop amiodarone at this time but do recognize the risks of this medicine. Not felt to be a candidate for coumadin Follow LFTs/TFTs today  2. HTN Elevated today though she reports good BP control at home They will continue to follow closely  3. Diastolic dysfunction Stable No change required today  4. SOB/ orthopnea Repeat echo at this time  Obtain pfts Return to see Cecille Rubin in 4 weeks to follow-up on the results  Return in 6 months

## 2013-12-31 ENCOUNTER — Ambulatory Visit (INDEPENDENT_AMBULATORY_CARE_PROVIDER_SITE_OTHER): Payer: Medicare Other | Admitting: Internal Medicine

## 2013-12-31 DIAGNOSIS — I4891 Unspecified atrial fibrillation: Secondary | ICD-10-CM

## 2013-12-31 LAB — PULMONARY FUNCTION TEST
DL/VA % pred: 56 %
DL/VA: 2.5 ml/min/mmHg/L
DLCO unc % pred: 35 %
DLCO unc: 7.34 ml/min/mmHg
FEF 25-75 POST: 0.46 L/s
FEF 25-75 PRE: 0.32 L/s
FEF2575-%Change-Post: 42 %
FEF2575-%PRED-POST: 40 %
FEF2575-%Pred-Pre: 28 %
FEV1-%Change-Post: 15 %
FEV1-%PRED-PRE: 45 %
FEV1-%Pred-Post: 52 %
FEV1-PRE: 0.73 L
FEV1-Post: 0.85 L
FEV1FVC-%Change-Post: 11 %
FEV1FVC-%PRED-PRE: 61 %
FEV6-%Change-Post: 5 %
FEV6-%PRED-POST: 80 %
FEV6-%Pred-Pre: 76 %
FEV6-POST: 1.64 L
FEV6-Pre: 1.56 L
FEV6FVC-%Change-Post: 1 %
FEV6FVC-%Pred-Post: 102 %
FEV6FVC-%Pred-Pre: 101 %
FVC-%Change-Post: 3 %
FVC-%PRED-PRE: 75 %
FVC-%Pred-Post: 78 %
FVC-POST: 1.7 L
FVC-Pre: 1.64 L
PRE FEV1/FVC RATIO: 45 %
Post FEV1/FVC ratio: 50 %
Post FEV6/FVC ratio: 97 %
Pre FEV6/FVC Ratio: 95 %

## 2013-12-31 NOTE — Progress Notes (Signed)
PFT done today. 

## 2014-01-01 ENCOUNTER — Telehealth: Payer: Self-pay | Admitting: *Deleted

## 2014-01-01 DIAGNOSIS — R942 Abnormal results of pulmonary function studies: Secondary | ICD-10-CM

## 2014-01-01 NOTE — Telephone Encounter (Signed)
Message copied by Dionicio Stall on Thu Jan 01, 2014  3:12 PM ------      Message from: Thompson Grayer      Created: Wed Dec 31, 2013  9:27 PM       Results reviewed.  Claiborne Billings, please inform pt of result.  Given significant airway obstruction which improved with bronchodilators, please refer to Dr Annamaria Boots for management. ------

## 2014-01-01 NOTE — Telephone Encounter (Signed)
Left message for patient that I was going to make a referral for her to pulmonary to follow up on PFT's

## 2014-01-07 ENCOUNTER — Ambulatory Visit (HOSPITAL_COMMUNITY): Payer: Medicare Other | Attending: Cardiovascular Disease | Admitting: Radiology

## 2014-01-07 ENCOUNTER — Encounter: Payer: Self-pay | Admitting: Cardiovascular Disease

## 2014-01-07 DIAGNOSIS — I4891 Unspecified atrial fibrillation: Secondary | ICD-10-CM | POA: Insufficient documentation

## 2014-01-07 DIAGNOSIS — I5031 Acute diastolic (congestive) heart failure: Secondary | ICD-10-CM

## 2014-01-07 DIAGNOSIS — I359 Nonrheumatic aortic valve disorder, unspecified: Secondary | ICD-10-CM

## 2014-01-07 NOTE — Progress Notes (Signed)
Echocardiogram performed.  

## 2014-01-19 ENCOUNTER — Ambulatory Visit (INDEPENDENT_AMBULATORY_CARE_PROVIDER_SITE_OTHER): Payer: Medicare Other | Admitting: Nurse Practitioner

## 2014-01-19 ENCOUNTER — Encounter: Payer: Self-pay | Admitting: Nurse Practitioner

## 2014-01-19 VITALS — BP 130/80 | HR 70 | Ht 63.0 in | Wt 91.1 lb

## 2014-01-19 DIAGNOSIS — R0602 Shortness of breath: Secondary | ICD-10-CM

## 2014-01-19 DIAGNOSIS — R942 Abnormal results of pulmonary function studies: Secondary | ICD-10-CM

## 2014-01-19 DIAGNOSIS — I5032 Chronic diastolic (congestive) heart failure: Secondary | ICD-10-CM

## 2014-01-19 NOTE — Patient Instructions (Signed)
Stay on your current medicines  I will see you as needed  Keep your follow up visits with Dr. Servando Snare, Dr. Rayann Heman and see Dr. Annamaria Boots as planned.  Call the [place] office at [phone] if you have any questions, problems or concerns.

## 2014-01-19 NOTE — Progress Notes (Signed)
Savannah Roberts Date of Birth: 1929-06-19 Medical Record #[id number]  History of Present Illness: [name] is seen back today for a 4 week check. She is seen for Dr. Rayann Heman. She has an an extensive past medical history which includes atrial fib, PACs, PVCs, HTN, tachy brady, DVT, emphysema and prior AVR in July of 2011 which was associated with a very long hospitalization. She has a significant size false aneurysm involving the ascending aorta instead of a dissection - followed by Dr. Servando Snare. The patient is not interested in further operative intervention due to her prolonged course from 2011.   Other issue includes diastolic heart failure, general frailty, and remains on amiodarone. No longer on coumadin given her frailty.   I last saw her in March of 2014. She has seen Dr. Servando Snare several times since - last in December of 2014. Last saw Dr. Rayann Heman in April of 2015 - complained of dyspnea - referred for echo - see below - EF ok. Patient and husband have not wanted to stop her amiodarone. Did have PFTs - showed significant obstruction and severe diffusion capacity but improved with bronchodilators - was referred to Dr. Annamaria Boots - to see him in June.   She comes in today. She is here with her husband. Doing ok. Seems to be holding her own. No chest pain. Her breathing is no different, no worse. Remains on low dose amiodarone. Weight fairly stable at home. Mr. Corso does a great job in looking after her. She tried to get her license renewed last week - did pass the vision test and the sign test but has to take the road test - did not feel up to that. No falls. Has family visiting later this month. Not dizzy or lightheaded.   Current Outpatient Prescriptions  Medication Sig Dispense Refill  . albuterol (VENTOLIN HFA) 108 (90 BASE) MCG/ACT inhaler Inhale 1 puff into the lungs as needed for wheezing or shortness of breath.      Marland Kitchen amiodarone (PACERONE) 200 MG tablet Take 0.5 tablets (100 mg total) by mouth  daily.  45 tablet  3  . aspirin 81 MG tablet Take 81 mg by mouth daily.        . cilostazol (PLETAL) 100 MG tablet Take 1 tablet (100 mg total) by mouth 2 (two) times daily.  180 tablet  3  . fenofibrate 160 MG tablet Take 1 tablet (160 mg total) by mouth daily.  90 tablet  3  . ferrous fumarate (HEMOCYTE - 106 MG FE) 325 (106 FE) MG TABS tablet Take 1 tablet by mouth 2 (two) times daily.       . furosemide (LASIX) 40 MG tablet Take 1 tablet (40 mg total) by mouth 2 (two) times daily.  180 tablet  3  . lisinopril (PRINIVIL,ZESTRIL) 5 MG tablet Take 1 tablet (5 mg total) by mouth 2 (two) times daily.  180 tablet  3  . metoprolol (LOPRESSOR) 50 MG tablet Take 1 tablet (50 mg total) by mouth 2 (two) times daily.  180 tablet  3  . Nutritional Supplements (BOOST PLUS PO) Take by mouth as needed.        . potassium chloride SA (K-DUR,KLOR-CON) 20 MEQ tablet Take 1 tablet (20 mEq total) by mouth daily.  90 tablet  3  . traMADol (ULTRAM) 50 MG tablet Take 50 mg by mouth every 6 (six) hours as needed.         No current facility-administered medications for this visit.  Allergies  Allergen Reactions  . Hydrocodone   . Rosuvastatin     REACTION: Reaction not known    Past Medical History  Diagnosis Date  . Atrial fibrillation   . PAC (premature atrial contraction)   . PVC (premature ventricular contraction)   . Hypertension   . Tachycardia-bradycardia syndrome   . DVT (deep venous thrombosis)   . Emphysema   . S/P aortic valve replacement     for severe symptomatic aortic stenosis by Dr Servando Snare  . S/P mastectomy   . Breast cancer   . Aortic dissection 07/15/12    Past Surgical History  Procedure Laterality Date  . Appendectomy    . Mastectomy    . Aortic valve replacement  03/29/2010     #21 pericardial tissue valve [employer] model #3300TFX serical number [id number]    History  Smoking status  . Former Smoker  Smokeless tobacco  . Not on file    History  Alcohol  Use No    Family History  Problem Relation Age of Onset  . Breast cancer Mother   . Stroke Sister   . Diabetes Brother   . Heart attack Brother     Review of Systems: The review of systems is per the HPI.  All other systems were reviewed and are negative.  Physical Exam: BP 130/80  Pulse 70  Ht 5\' 3"  (1.6 m)  Wt 91 lb 1.9 oz (41.332 kg)  BMI 16.15 kg/m2  SpO2 94% Patient is very pleasant and in no acute distress. She is quite frail and looks really no different from when I last saw her. Skin is warm and dry. Color is normal.  HEENT is unremarkable. Normocephalic/atraumatic. PERRL. Sclera are nonicteric. Neck is supple. No masses. No JVD. Lungs are coarse. Cardiac exam shows a regular rate and rhythm. Harsh outflow murmur. Abdomen is soft. Extremities are without edema. Gait and ROM are intact. No gross neurologic deficits noted.  Wt Readings from Last 3 Encounters:  01/19/14 91 lb 1.9 oz (41.332 kg)  12/26/13 92 lb (41.731 kg)  08/21/13 97 lb (43.999 kg)     LABORATORY DATA:  Lab Results  Component Value Date   WBC 3.0* 12/06/2012   HGB 10.5* 12/06/2012   HCT 30.7* 12/06/2012   PLT 188.0 12/06/2012   GLUCOSE 98 06/02/2013   ALT 13 12/26/2013   AST 36 12/26/2013   NA 141 06/02/2013   K 3.6 06/02/2013   CL 105 06/02/2013   CREATININE 1.2 06/02/2013   BUN 27* 06/02/2013   CO2 32 06/02/2013   TSH 1.35 12/26/2013   INR 3.1 07/11/2012   HGBA1C  Value: 5.4 (NOTE)                                                                       According to the ADA Clinical Practice Recommendations for 2011, when HbA1c is used as a screening test:   >=6.5%   Diagnostic of Diabetes Mellitus           (if abnormal result  is confirmed)  5.7-6.4%   Increased risk of developing Diabetes Mellitus  References:Diagnosis and Classification of Diabetes Mellitus,Diabetes [place],6378,58(IFOYD 1):S62-S69 and Standards of Medical Care in  Diabetes - 2011,Diabetes Care,2011,34  (Suppl 1):S11-S61. 03/24/2010    Echo Study Conclusions  - Left ventricle: The cavity size was normal. There was mild concentric hypertrophy. Assymetric septal hypertrophy - basal septum measures 1.75 cm. Systolic function was normal. The estimated ejection fraction was in the range of 60% to 65%. Wall motion was normal; there were no regional wall motion abnormalities. Doppler parameters are consistent with diastolic dysfunction and elevated LV filling pressure. - Aortic valve: #21 [employer] pericardial tissue valve, well-seated. Leaflets are poorly visualized. Peak and mean gradients of 19 mmHg and 10 mmHg, which is acceptable for this valve in the aortic position. Trivial regurgitation. - Mitral valve: Calcified annulus. Moderately calcified leaflets with flat coaptation. Mild to moderate regurgitation. - Right atrium: The atrium was at the upper limits of normal in size. - Tricuspid valve: Moderate regurgitation. - Pulmonary arteries: PA peak pressure: 58mm Hg (S). - Inferior vena cava: The vessel was normal in size; the respirophasic diameter changes were in the normal range (= 50%); findings are consistent with normal central venous pressure. - Pericardium, extracardiac: There was no pericardial effusion.  (read by Dr. Debara Pickett)  Assessment / Plan: 1. Diastolic heart failure - She continues to "hold her own". No change in her current regimen. Husband does a great job in managing her chronic illness/meds, etc.   2. False aneurysm - she is not interested in further surgical procedures.   3. HTN - BP fine with her current regimen.  4. PAF - remains on amiodarone - despite her PFT's. Husband and patient do not wish to stop.  5. Dyspnea - seeing pulmonary in June - they hope for a conservative approach.   I will see back as needed. She has follow up with Dr. Servando Snare in June, Dr. Annamaria Boots in June and Dr. Rayann Heman in October.   Patient is agreeable to this plan and will call if any problems develop in the  interim.   Burtis Junes, RN, [employer] [address] [place], [place]  [phone]

## 2014-02-17 ENCOUNTER — Ambulatory Visit (INDEPENDENT_AMBULATORY_CARE_PROVIDER_SITE_OTHER): Payer: Medicare Other | Admitting: Internal Medicine

## 2014-02-17 ENCOUNTER — Ambulatory Visit (INDEPENDENT_AMBULATORY_CARE_PROVIDER_SITE_OTHER)
Admission: RE | Admit: 2014-02-17 | Discharge: 2014-02-17 | Disposition: A | Discharge status: Home / Self Care | Payer: Medicare Other | Source: Ambulatory Visit | Attending: Internal Medicine | Admitting: Internal Medicine

## 2014-02-17 ENCOUNTER — Encounter: Payer: Self-pay | Admitting: Internal Medicine

## 2014-02-17 VITALS — BP 132/80 | HR 72 | Ht 63.0 in | Wt 94.4 lb

## 2014-02-17 DIAGNOSIS — I38 Endocarditis, valve unspecified: Secondary | ICD-10-CM

## 2014-02-17 DIAGNOSIS — I062 Rheumatic aortic stenosis with insufficiency: Secondary | ICD-10-CM

## 2014-02-17 DIAGNOSIS — J449 Chronic obstructive pulmonary disease, unspecified: Secondary | ICD-10-CM

## 2014-02-17 MED ORDER — UMECLIDINIUM-VILANTEROL 62.5-25 MCG/INH IN AEPB: INHALATION_SPRAY | RESPIRATORY_TRACT | Status: AC

## 2014-02-17 MED ORDER — UMECLIDINIUM-VILANTEROL 62.5-25 MCG/INH IN AEPB: 1.0000 | INHALATION_SPRAY | Freq: Every day | RESPIRATORY_TRACT | Status: DC

## 2014-02-17 NOTE — Progress Notes (Signed)
38 yoF former smoker FOLLOWS FOR: Referred by Dr. Rayann Heman for abnormal pulmonary function test.  Cardiology follows for AFib, diastolic CHF, Rheumatic aortic stenosis/ spAVR Husband here SOB x at least 7-8 years. They haven't recognized change in dyspnea with light exertion/ ADLs over the past year. Comfortable at rest in bed or sitting still. As soon as she sits up in the morning, she becomes labored. Her breathing doesn't seem to wake her. Little wheeze. Light cough with clear phlegm. Proair inhaler helps, used 1-2xdaily. Advair in past no help. Anemia required transfusion at time of L mastectomy/ chemoRx. No active blood loss.  PFT 12/31/13- severe obstructive disease with mild respnse to BD, Diffusion severely reduced. FVC 1.70/ 78%, FEV1 0.85/ 52%, FEV1/FVC 0.50, FEF25-75% 0.46/ 44%, DLCO 35%  Prior to Admission medications   Medication Sig Start Date End Date Taking? Authorizing Provider  albuterol (VENTOLIN HFA) 108 (90 BASE) MCG/ACT inhaler Inhale 1 puff into the lungs as needed for wheezing or shortness of breath.   Yes Historical Provider, MD  aspirin 81 MG tablet Take 81 mg by mouth daily.     Yes Historical Provider, MD  cilostazol (PLETAL) 100 MG tablet Take 1 tablet (100 mg total) by mouth 2 (two) times daily. 05/23/13  Yes Burtis Junes, NP  fenofibrate 160 MG tablet Take 1 tablet (160 mg total) by mouth daily. 06/02/13  Yes Thompson Grayer, MD  ferrous fumarate (HEMOCYTE - 106 MG FE) 325 (106 FE) MG TABS tablet Take 1 tablet by mouth 2 (two) times daily.    Yes Historical Provider, MD  furosemide (LASIX) 40 MG tablet Take 1 tablet (40 mg total) by mouth 2 (two) times daily. 10/07/12  Yes Thompson Grayer, MD  lisinopril (PRINIVIL,ZESTRIL) 5 MG tablet Take 1 tablet (5 mg total) by mouth 2 (two) times daily. 06/02/13  Yes Thompson Grayer, MD  metoprolol (LOPRESSOR) 50 MG tablet Take 1 tablet (50 mg total) by mouth 2 (two) times daily. 06/02/13  Yes Thompson Grayer, MD  Nutritional Supplements (BOOST  PLUS PO) Take by mouth as needed.     Yes Historical Provider, MD  potassium chloride SA (K-DUR,KLOR-CON) 20 MEQ tablet Take 1 tablet (20 mEq total) by mouth daily. 06/02/13  Yes Thompson Grayer, MD  traMADol (ULTRAM) 50 MG tablet Take 50 mg by mouth every 6 (six) hours as needed.     Yes Historical Provider, MD  amiodarone (PACERONE) 200 MG tablet Take 0.5 tablets (100 mg total) by mouth daily. 11/15/12 01/19/14  Burtis Junes, NP  Umeclidinium-Vilanterol (ANORO ELLIPTA) 62.5-25 MCG/INH AEPB 1 puff, one time daily maintenance 02/17/14   Deneise Lever, MD  Umeclidinium-Vilanterol [facility] ELLIPTA) 62.5-25 MCG/INH AEPB Inhale 1 puff into the lungs daily. 02/17/14   Deneise Lever, MD   Past Medical History  Diagnosis Date  . Atrial fibrillation   . PAC (premature atrial contraction)   . PVC (premature ventricular contraction)   . Hypertension   . Tachycardia-bradycardia syndrome   . DVT (deep venous thrombosis)   . Emphysema   . S/P aortic valve replacement     for severe symptomatic aortic stenosis by Dr Servando Snare  . S/P mastectomy   . Breast cancer   . Aortic dissection 07/15/12   Past Surgical History  Procedure Laterality Date  . Appendectomy    . Mastectomy    . Aortic valve replacement  03/29/2010     #21 pericardial tissue valve [facility] model #3300TFX serical number [id number]   Family History  Problem Relation Age of Onset  . Breast cancer Mother   . Stroke Sister   . Diabetes Brother   . Heart attack Brother    History   Social History  . Marital Status: Married    Spouse Name: N/A    Number of Children: N/A  . Years of Education: N/A   Occupational History  . retired    Social History Main Topics  . Smoking status: Former Smoker -- 0.75 packs/day for 30 years    Types: Cigarettes    Quit date: 09/11/1981  . Smokeless tobacco: Never Used  . Alcohol Use: No  . Drug Use: No  . Sexual Activity: No   Other Topics Concern  . Not on file   Social History  Narrative  . No narrative on file   ROS-see HPI Constitutional:   No-   weight loss, night sweats, fevers, chills, fatigue, lassitude. HEENT:   No-  headaches, difficulty swallowing, tooth/dental problems, sore throat,       No-  sneezing, itching, ear ache, nasal congestion, post nasal drip,  CV:  No-   chest pain, orthopnea, PND, swelling in lower extremities, anasarca,                                  dizziness, palpitations Resp: No-   shortness of breath with exertion or at rest.              No-   productive cough,  No non-productive cough,  No- coughing up of blood.              No-   change in color of mucus.  No- wheezing.   Skin: No-   rash or lesions. GI:  No-   heartburn, indigestion, abdominal pain, nausea, vomiting, diarrhea,                 change in bowel habits, loss of appetite GU: No-   dysuria, change in color of urine, no urgency or frequency.  No- flank pain. MS:  No-   joint pain or swelling.  No- decreased range of motion.  No- back pain. Neuro-     nothing unusual Psych:  No- change in mood or affect. No depression or anxiety.  No memory loss.  OBJ- Physical Exam General- Alert, Oriented, Affect-appropriate, Distress- none acute. +Frail, elderly Skin- rash-none, lesions- none, excoriation- none Lymphadenopathy- none Head- atraumatic            Eyes- Gross vision intact, PERRLA, conjunctivae and secretions clear            Ears- +hard of hearing            Nose- Clear, no-Septal dev, mucus, polyps, erosion, perforation             Throat- Mallampati II , mucosa clear , drainage- none, tonsils- atrophic Neck- flexible , trachea midline, no stridor , thyroid nl, carotid no bruit Chest - symmetrical excursion , unlabored           Heart/CV- RRR , 2-3 AS  murmur radiates to back, no gallop  , no rub, nl s1 s2                           - JVD- none , edema- none, stasis changes- none, varices- none           Lung- clear  to P&A, wheeze- none, cough- none ,  dullness-none, rub- none           Chest wall- +Kyphotic Abd- tender-no, distended-no, bowel sounds-present, HSM- no Br/ Gen/ Rectal- Not done, not indicated Extrem- cyanosis- none, clubbing, none, atrophy- none, strength- nl Neuro- grossly intact to observation

## 2014-02-17 NOTE — Assessment & Plan Note (Signed)
Mostly emphysema with small reactive component. She notes benefit from use of rescue inhaler, so will try a newer maintenance inhaler Cardiac disease and deconditioning also contribute to dyspnea. Plan- try Anoro, CXR

## 2014-02-17 NOTE — Assessment & Plan Note (Signed)
Managed by crdiology

## 2014-02-17 NOTE — Patient Instructions (Signed)
Sample and script  for trial Anoro maintenance inhaler   1 puff, one time every day  Ok to still use the Proair albuterol rescue inhaler up to 2 puffs, 4 times daily, as needed  Order CXR - Dx COPD, valvular heart disease

## 2014-02-19 ENCOUNTER — Encounter: Payer: Self-pay | Admitting: Cardiothoracic Surgery

## 2014-02-19 ENCOUNTER — Ambulatory Visit (INDEPENDENT_AMBULATORY_CARE_PROVIDER_SITE_OTHER): Payer: Medicare Other | Admitting: Cardiothoracic Surgery

## 2014-02-19 ENCOUNTER — Telehealth: Payer: Self-pay | Admitting: Internal Medicine

## 2014-02-19 VITALS — BP 140/74 | HR 75 | Resp 16 | Ht 63.0 in | Wt 94.4 lb

## 2014-02-19 DIAGNOSIS — Z954 Presence of other heart-valve replacement: Secondary | ICD-10-CM

## 2014-02-19 DIAGNOSIS — Z952 Presence of prosthetic heart valve: Secondary | ICD-10-CM

## 2014-02-19 DIAGNOSIS — I729 Aneurysm of unspecified site: Secondary | ICD-10-CM

## 2014-02-19 DIAGNOSIS — I7100 Dissection of unspecified site of aorta: Secondary | ICD-10-CM

## 2014-02-19 NOTE — Telephone Encounter (Signed)
Called and spoke with pts husband and he is aware of results of CXR per CY.  Nothing further is needed.

## 2014-02-19 NOTE — Progress Notes (Signed)
[address]       [place] [address]             [place] Record #[id number] Date of Birth: 07-07-1929  Referring: Dr Rayann Heman Primary Care: Ronita Hipps, MD  Chief Complaint:    Chief Complaint  Patient presents with  . Follow-up    6 months.Marland KitchenMarland KitchenCXR DONE A FEW DAYS AGO   Tissue aortic valve replacement 03/19/2010  History of Present Illness:     She comes in today for followup visit, she has unchanged  of congestive heart failure with no  pedal edema pedal edema. She notes occasional episode of sob at night, sitts up for a few min and uses inhaler with improvement does not occur during the day.  She was seen by pulmonary 2 days ago and started on a different scheme of inhalers . She has not noticed a lot of difference in this he .Overall since she was seen 6 months ago she seems stable.  However she remains very frail and her physical ability is very limited.  Past Medical History  Diagnosis Date  . Atrial fibrillation   . PAC (premature atrial contraction)   . PVC (premature ventricular contraction)   . Hypertension   . Tachycardia-bradycardia syndrome   . DVT (deep venous thrombosis)   . Emphysema   . S/P aortic valve replacement     for severe symptomatic aortic stenosis by Dr Servando Snare  . S/P mastectomy   . Breast cancer   . Aortic dissection 07/15/12    Past Surgical History  Procedure Laterality Date  . Appendectomy    . Mastectomy    . Aortic valve replacement  03/29/2010     #21 pericardial tissue valve [employer] model #3300TFX serical number [id number]    Family History  Problem Relation Age of Onset  . Breast cancer Mother   . Stroke Sister   . Diabetes Brother   . Heart attack Brother     History   Social History  . Marital Status: Married    Spouse Name: N/A    Number of Children: N/A  . Years of Education: N/A   Occupational History  . retired    Social  History Main Topics  . Smoking status: Former Smoker -- 0.75 packs/day for 30 years    Types: Cigarettes    Quit date: 09/11/1981  . Smokeless tobacco: Never Used  . Alcohol Use: No  . Drug Use: No  . Sexual Activity: No   Other Topics Concern  . Not on file   Social History Narrative  . No narrative on file    History  Smoking status  . Former Smoker -- 0.75 packs/day for 30 years  . Types: Cigarettes  . Quit date: 09/11/1981  Smokeless tobacco  . Never Used    History  Alcohol Use No     Allergies  Allergen Reactions  . Hydrocodone   . Rosuvastatin     REACTION: Reaction not known    Current Outpatient Prescriptions  Medication Sig Dispense Refill  . albuterol (VENTOLIN HFA) 108 (90 BASE) MCG/ACT inhaler Inhale 1 puff into the lungs as needed for wheezing or shortness of breath.      Marland Kitchen aspirin 81 MG tablet Take 81 mg by mouth daily.        . cilostazol (PLETAL) 100 MG tablet  Take 1 tablet (100 mg total) by mouth 2 (two) times daily.  180 tablet  3  . fenofibrate 160 MG tablet Take 1 tablet (160 mg total) by mouth daily.  90 tablet  3  . ferrous fumarate (HEMOCYTE - 106 MG FE) 325 (106 FE) MG TABS tablet Take 1 tablet by mouth 2 (two) times daily.       . furosemide (LASIX) 40 MG tablet Take 1 tablet (40 mg total) by mouth 2 (two) times daily.  180 tablet  3  . lisinopril (PRINIVIL,ZESTRIL) 5 MG tablet Take 1 tablet (5 mg total) by mouth 2 (two) times daily.  180 tablet  3  . metoprolol (LOPRESSOR) 50 MG tablet Take 1 tablet (50 mg total) by mouth 2 (two) times daily.  180 tablet  3  . Nutritional Supplements (BOOST PLUS PO) Take by mouth as needed.        . traMADol (ULTRAM) 50 MG tablet Take 50 mg by mouth every 6 (six) hours as needed.        Marland Kitchen Umeclidinium-Vilanterol (ANORO ELLIPTA) 62.5-25 MCG/INH AEPB 1 puff, one time daily maintenance  1 each  prn  . Umeclidinium-Vilanterol (ANORO ELLIPTA) 62.5-25 MCG/INH AEPB Inhale 1 puff into the lungs daily.  1 each  0  .  potassium chloride SA (K-DUR,KLOR-CON) 20 MEQ tablet Take 1 tablet (20 mEq total) by mouth daily.  90 tablet  3   No current facility-administered medications for this visit.       Review of Systems:     Cardiac Review of Systems: Y or N  Chest Pain [ n   ]  Resting SOB [ y  ] Exertional SOB  Blue.Reese  ]  Orthopnea [ y]   Pedal Edema [ n  ]    Palpitations [ n ] Syncope  [ n ]   Presyncope [  n ]  General Review of Systems: [Y] = yes [  ]=no Constitional: recent weight change [  ]; anorexia [  ]; fatigue [  ]; nausea [  ]; night sweats [  ]; fever [  ]; or chills [  ];                                                                                                                                          Dental: poor dentition[  ]; Last Dentist visit:   Eye : blurred vision [ n ]; diplopia [   ]; vision changes [  ];  Amaurosis fugax[  ]; Resp: cough [  ];  wheezing[y ];  hemoptysis[ n ]; shortness of breath[ y ]; paroxysmal nocturnal dyspnea[  ]; dyspnea on exertion[  ]; or orthopnea[ y ];  GI:  gallstones[  ], vomiting[  ];  dysphagia[  ]; melena[  ];  hematochezia [  ]; heartburn[  ];   Hx of  Colonoscopy[  ];  GU: kidney stones [  ]; hematuria[  ];   dysuria [  ];  nocturia[  ];  history of     obstruction [  ];             Skin: rash, swelling[  ];, hair loss[  ];  peripheral edema[  ];  or itching[  ]; Musculosketetal: myalgias[  ];  joint swelling[  ];  joint erythema[  ];  joint pain[  ];  back pain[  ];  Heme/Lymph: bruising[  ];  bleeding[  ];  anemia[  ];  Neuro: TIA[  ];  headaches[  ];  stroke[  ];  vertigo[  ];  seizures[  ];   paresthesias[  ];  difficulty walking[  ];  Psych:depression[  ]; anxiety[  ];  Endocrine: diabetes[  ];  thyroid dysfunction[  ];  Immunizations: Flu [  ]; Pneumococcal[  ];  Other:  Physical Exam: BP 140/74  Pulse 75  Resp 16  Ht 5\' 3"  (1.6 m)  Wt 94 lb 6.4 oz (42.82 kg)  BMI 16.73 kg/m2  SpO2 93%  General appearance: alert, cooperative, appears  older than stated age, cachectic, fatigued and no distress Neurologic: intact Heart: regular rate and rhythm, S1, S2 normal, systolic ejection murmur 3/6 is evident, click, rub or gallop and regular rate and rhythm, murmur unchanged Lungs: clear to auscultation bilaterally and normal percussion bilaterally Abdomen: soft, non-tender; bowel sounds normal; no masses,  no organomegaly Extremities: extremities normal, atraumatic, no cyanosis or edema and no edema, redness or tenderness in the calves or thighs Wound: sternum stable, feet with out any ulcers or open wounds   Diagnostic Studies & Laboratory data:     Recent Radiology Findings:   Dg Chest 2 View  02/17/2014   CLINICAL DATA:  COPD and difficulty breathing  EXAM: CHEST  2 VIEW  COMPARISON:  July 18, 2012  FINDINGS: There is underlying emphysematous change. There is a chronic small effusion on the left. There is no edema or consolidation.  The heart is mildly enlarged. The pulmonary vascularity reflects underlying emphysema. There is an aortic valve replacement. Aorta is tortuous. There is thoracic dextroscoliosis. There are surgical clips in the left axillary region.  IMPRESSION: Emphysema with small left effusion. No edema or consolidation. Stable aortic tortuosity. Patient is status post aortic valve replacement.   Electronically Signed   By: Lowella Grip M.D.   On: 02/17/2014 16:30    CT from [place]: CT ANGIOGRAPHY CHEST  Technique: Multidetector CT imaging of the chest using the standard protocol during bolus administration of intravenous contrast. Multiplanar reconstructed images including MIPs were obtained and reviewed to evaluate the vascular anatomy.  Contrast: 80 ml Isovue 370. Per technologist report, extravasation of approximately 20-25 ml of contrast occurred during the injection.  Comparison: None.  Findings: Contrast opacification of pulmonary arteries is good. No filling defects within either main pulmonary  artery or their branches in either lung to suggest pulmonary embolism. However, there are filling defects within branches of the left inferior pulmonary vein. No other pulmonary venous filling defects are identified.  Prior aortic valve replacement. Dissection involving the descending thoracic aorta, with contrast opacification of the false lumen. The dissection extends to the arch, but does not involve the origins of the great vessels. There is no evidence of dissection involving the descending thoracic aorta or the visualized upper abdominal aorta.  Heart enlarged with biatrial enlargement and left ventricular hypertrophy. No pericardial effusion.  Severe bullous emphysematous changes  throughout both lungs. Moderately large left pleural effusion. Dense consolidation in the left lower lobe. Mosaic attenuation throughout both lungs, likely a manifestation of the severe COPD. Minimal right pleural effusion.  Scattered mildly enlarged lymph nodes throughout the mediastinum, the largest a precarinal node approximating 2.0 x 1.4 cm. Visualized thyroid gland unremarkable.  Hyperdense liver without focal hepatic parenchymal abnormality. Atrophic left kidney. Remaining visualized upper abdomen unremarkable. Bone window images demonstrate thoracic spondylosis, severe osteopenia, and exaggeration of the usual thoracic kyphosis.  IMPRESSION:  1. No evidence of pulmonary embolism. 2. Pulmonary venous thrombosis involving branches of the left inferior pulmonary vein.  3. Left lower lobe pneumonia. Moderately large left pleural effusion. 4. Severe COPD/emphysema. 5. Prior aortic valve replacement. Type A aortic dissection extending to the aortic arch, without involvement of the proximal great vessels or the descending thoracic aorta. 6. Hyperdense liver, likely a manifestation of amiodarone therapy 7. Atrophic left kidney. 8. Likely reactive mediastinal  lymphadenopathy.  ECHO: Study Conclusions  - Left ventricle: The cavity size was normal. There was mild concentric hypertrophy. Assymetric septal hypertrophy - basal septum measures 1.75 cm. Systolic function was normal. The estimated ejection fraction was in the range of 60% to 65%. Wall motion was normal; there were no regional wall motion abnormalities. Doppler parameters are consistent with diastolic dysfunction and elevated LV filling pressure. - Aortic valve: #21 [employer] pericardial tissue valve, well-seated. Leaflets are poorly visualized. Peak and mean gradients of 19 mmHg and 10 mmHg, which is acceptable for this valve in the aortic position. Trivial regurgitation. - Mitral valve: Calcified annulus. Moderately calcified leaflets with flat coaptation. Mild to moderate regurgitation. - Right atrium: The atrium was at the upper limits of normal in size. - Tricuspid valve: Moderate regurgitation. - Pulmonary arteries: PA peak pressure: 52mm Hg (S). - Inferior vena cava: The vessel was normal in size; the respirophasic diameter changes were in the normal range (= 50%); findings are consistent with normal central venous pressure. - Pericardium, extracardiac: There was no pericardial effusion.     Recent Lab Findings: Lab Results  Component Value Date   WBC 3.0* 12/06/2012   HGB 10.5* 12/06/2012   HCT 30.7* 12/06/2012   PLT 188.0 12/06/2012   GLUCOSE 98 06/02/2013   ALT 13 12/26/2013   AST 36 12/26/2013   NA 141 06/02/2013   K 3.6 06/02/2013   CL 105 06/02/2013   CREATININE 1.2 06/02/2013   BUN 27* 06/02/2013   CO2 32 06/02/2013   TSH 1.35 12/26/2013   INR 3.1 07/11/2012   HGBA1C  Value: 5.4 (NOTE)                                                                       According to the ADA Clinical Practice Recommendations for 2011, when HbA1c is used as a screening test:   >=6.5%   Diagnostic of Diabetes Mellitus           (if abnormal result  is confirmed)  5.7-6.4%    Increased risk of developing Diabetes Mellitus  References:Diagnosis and Classification of Diabetes Mellitus,Diabetes GOTL,5726,20(BTDHR 1):S62-S69 and Standards of Medical Care in         Diabetes - 2011,Diabetes CBUL,8453,64  (Suppl 1):S11-S61. 03/24/2010  Assessment / Plan:   She has no desire for any operative procedure Heart failure symptoms are stable  Will see in 6 months seeing cardiology in 3 months  Grace Isaac MD  Beeper [phone] Office [phone] 02/19/2014 11:50 AM

## 2014-03-31 ENCOUNTER — Encounter: Payer: Self-pay | Admitting: Internal Medicine

## 2014-03-31 ENCOUNTER — Ambulatory Visit (INDEPENDENT_AMBULATORY_CARE_PROVIDER_SITE_OTHER): Payer: Medicare Other | Admitting: Internal Medicine

## 2014-03-31 VITALS — BP 120/68 | HR 96 | Ht 63.0 in | Wt 95.2 lb

## 2014-03-31 DIAGNOSIS — I4891 Unspecified atrial fibrillation: Secondary | ICD-10-CM

## 2014-03-31 DIAGNOSIS — I482 Chronic atrial fibrillation, unspecified: Secondary | ICD-10-CM

## 2014-03-31 DIAGNOSIS — J449 Chronic obstructive pulmonary disease, unspecified: Secondary | ICD-10-CM

## 2014-03-31 NOTE — Patient Instructions (Signed)
Ok to continue Anoro  Ask Dr Helene Kelp to check the status of your anemia when you see her next. Your red blood cells carry oxygen, and when they are low, you can feel weaker and more short of breath.

## 2014-03-31 NOTE — Progress Notes (Signed)
89 yoF former smoker FOLLOWS FOR: Referred by Dr. Rayann Heman for abnormal pulmonary function test.  Cardiology follows for AFib, diastolic CHF, Rheumatic aortic stenosis/ spAVR Husband here SOB x at least 7-8 years. They haven't recognized change in dyspnea with light exertion/ ADLs over the past year. Comfortable at rest in bed or sitting still. As soon as she sits up in the morning, she becomes labored. Her breathing doesn't seem to wake her. Little wheeze. Light cough with clear phlegm. Proair inhaler helps, used 1-2xdaily. Advair in past no help. Anemia required transfusion at time of L mastectomy/ chemoRx. No active blood loss.  PFT 12/31/13- severe obstructive disease with mild respnse to BD, Diffusion severely reduced. FVC 1.70/ 78%, FEV1 0.85/ 52%, FEV1/FVC 0.50, FEF25-75% 0.46/ 44%, DLCO 35%  03/31/14-  6 yoF former smoker FOLLOWS FOR: COPD w asthma Cardiology follows for AFib, diastolic CHF, Rheumatic aortic stenosis/ spAVR Husband here Anoro inhaler helps some. On iron for chronic anemia She had had home oxygen but didn't use it much CXR 02/17/14 IMPRESSION:  Emphysema with small left effusion. No edema or consolidation.  Stable aortic tortuosity. Patient is status post aortic valve  replacement.  Electronically Signed  By: Lowella Grip M.D.  On: 02/17/2014 16:30  ROS-see HPI Constitutional:   No-   weight loss, night sweats, fevers, chills, fatigue, lassitude. HEENT:   No-  headaches, difficulty swallowing, tooth/dental problems, sore throat,       No-  sneezing, itching, ear ache, nasal congestion, post nasal drip,  CV:  No-   chest pain, orthopnea, PND, swelling in lower extremities, anasarca,                                  dizziness, palpitations Resp: No-   shortness of breath with exertion or at rest.              No-   productive cough,  No non-productive cough,  No- coughing up of blood.              No-   change in color of mucus.  No- wheezing.   Skin: No-   rash or  lesions. GI:  No-   heartburn, indigestion, abdominal pain, nausea, vomiting,  GU: . MS:  No-   joint pain or swelling.   Neuro-     nothing unusual Psych:  No- change in mood or affect. No depression or anxiety.  No memory loss.  OBJ- Physical Exam General- Alert, Oriented, Affect-appropriate, Distress- none acute. +Frail, elderly, disheveled Skin- rash-none, lesions- none, excoriation- none Lymphadenopathy- none Head- atraumatic            Eyes- Gross vision intact, PERRLA, conjunctivae and secretions clear            Ears- +hard of hearing            Nose- Clear, no-Septal dev, mucus, polyps, erosion, perforation             Throat- Mallampati II , mucosa clear , drainage- none, tonsils- atrophic Neck- flexible , trachea midline, no stridor , thyroid nl, carotid no bruit Chest - symmetrical excursion , unlabored           Heart/CV- RRR , +2-3/6 AS  murmur radiates to back, no gallop  , no rub, nl s1 s2                           -  JVD- none , edema- none, stasis changes- none, varices- none           Lung- clear to P&A, +diminished, wheeze- none, cough- none , dullness-none, rub- none           Chest wall- +Kyphotic Abd-  Br/ Gen/ Rectal- Not done, not indicated Extrem- cyanosis- none, clubbing, none, atrophy- none, strength- nl Neuro- grossly intact to observation

## 2014-07-06 ENCOUNTER — Encounter: Payer: Self-pay | Admitting: Internal Medicine

## 2014-07-06 ENCOUNTER — Ambulatory Visit (INDEPENDENT_AMBULATORY_CARE_PROVIDER_SITE_OTHER): Payer: Medicare Other | Admitting: Internal Medicine

## 2014-07-06 VITALS — BP 148/72 | HR 79 | Ht 63.0 in | Wt 100.6 lb

## 2014-07-06 DIAGNOSIS — I5032 Chronic diastolic (congestive) heart failure: Secondary | ICD-10-CM

## 2014-07-06 DIAGNOSIS — R0602 Shortness of breath: Secondary | ICD-10-CM

## 2014-07-06 DIAGNOSIS — I495 Sick sinus syndrome: Secondary | ICD-10-CM

## 2014-07-06 DIAGNOSIS — J449 Chronic obstructive pulmonary disease, unspecified: Secondary | ICD-10-CM

## 2014-07-06 DIAGNOSIS — I4891 Unspecified atrial fibrillation: Secondary | ICD-10-CM

## 2014-07-06 DIAGNOSIS — I10 Essential (primary) hypertension: Secondary | ICD-10-CM

## 2014-07-06 MED ORDER — METOPROLOL TARTRATE 50 MG PO TABS
25.0000 mg | ORAL_TABLET | Freq: Two times a day (BID) | ORAL | Status: AC
Start: 2014-07-06 — End: ?

## 2014-07-06 NOTE — Progress Notes (Signed)
PCP: Ronita Hipps, MD  Savannah Roberts is a 80 y.o. female who presents today for routine cardiology followup. She continues to have SOB.  She has been evaluated by pulmonary and diagnosed with emphysema.  She has not well tolerated her inhalers that were prescribed.  Her SOB persists. Today, she denies symptoms of palpitations, chest pain, dizziness, presyncope, or syncope.  The patient is otherwise without complaint today.   Past Medical History  Diagnosis Date  . Atrial fibrillation   . PAC (premature atrial contraction)   . PVC (premature ventricular contraction)   . Hypertension   . Tachycardia-bradycardia syndrome   . DVT (deep venous thrombosis)   . Emphysema   . S/P aortic valve replacement     for severe symptomatic aortic stenosis by Dr Servando Snare  . S/P mastectomy   . Breast cancer   . Aortic dissection 07/15/12   Past Surgical History  Procedure Laterality Date  . Appendectomy    . Mastectomy    . Aortic valve replacement  03/29/2010     #21 pericardial tissue valve [employer] model #3300TFX serical number [id number]    Current Outpatient Prescriptions  Medication Sig Dispense Refill  . albuterol (VENTOLIN HFA) 108 (90 BASE) MCG/ACT inhaler Inhale 1 puff into the lungs as needed for wheezing or shortness of breath.      Marland Kitchen aspirin 81 MG tablet Take 81 mg by mouth daily.        . cilostazol (PLETAL) 100 MG tablet Take 1 tablet (100 mg total) by mouth 2 (two) times daily.  180 tablet  3  . fenofibrate 160 MG tablet Take 1 tablet (160 mg total) by mouth daily.  90 tablet  3  . ferrous fumarate (HEMOCYTE - 106 MG FE) 325 (106 FE) MG TABS tablet Take 1 tablet by mouth 2 (two) times daily.       . furosemide (LASIX) 40 MG tablet Take 1 tablet (40 mg total) by mouth 2 (two) times daily.  180 tablet  3  . lisinopril (PRINIVIL,ZESTRIL) 5 MG tablet Take 1 tablet (5 mg total) by mouth 2 (two) times daily.  180 tablet  3  . metoprolol (LOPRESSOR) 50 MG tablet Take 0.5 tablets (25  mg total) by mouth 2 (two) times daily.  180 tablet  3  . Nutritional Supplements (BOOST PLUS PO) Take 1 Container by mouth as needed.       . potassium chloride SA (K-DUR,KLOR-CON) 20 MEQ tablet Take 1 tablet (20 mEq total) by mouth daily.  90 tablet  3  . traMADol (ULTRAM) 50 MG tablet Take 50 mg by mouth every 6 (six) hours as needed (pain).       Marland Kitchen Umeclidinium-Vilanterol (ANORO ELLIPTA) 62.5-25 MCG/INH AEPB 1 puff, one time daily maintenance  1 each  prn   No current facility-administered medications for this visit.    Physical Exam: Filed Vitals:   07/06/14 1117  BP: 148/72  Pulse: 79  Height: 5\' 3"  (1.6 m)  Weight: 100 lb 9.6 oz (45.632 kg)    GEN- The patient is frail and elderly appearing, alert and oriented x 3 today.   Head- normocephalic, atraumatic Eyes-  Sclera clear, conjunctiva pink Ears- hearing intact Oropharynx- clear Lungs-decreased BS at the bases, normal work of breathing Heart- Regular rate and rhythm, 4/6 SEM LLSB GI- soft, NT, ND, + BS Extremities- no clubbing, cyanosis, trace edema  ekg today reveals sinus rhythm 79 bpm,  Septal infarct  Assessment and Plan:  1. afib  Well controlled with amiodarone--> given shortness of breath, I will stop amiodarone today.  If she has further AF then we could try multaq, tikosyn, or restart amiodarone.   Not felt to be a candidate for coumadin.  Given lung disease, I will also decrease metoprolol to 25mg  BID.  2. HTN Stable No change required today  3. Diastolic dysfunction Stable No change required today  4. SOB/ orthopnea As above Pulmonary following I have encourage her to discuss intolerance of her inhalers with Dr Annamaria Boots  Return in 2 months to see Truitt Merle I will see in 6 months

## 2014-07-06 NOTE — Patient Instructions (Signed)
Your physician recommends that you schedule a follow-up appointment in: 2 months with Truitt Merle, NP and 6 months with Dr Rayann Heman    Your physician has recommended you make the following change in your medication:  1) Stop Amiodarone 2) Decrease Metoprolol to 25 mg twice daily

## 2014-07-12 NOTE — Assessment & Plan Note (Signed)
We discussed contributions to dyspnea from heart disease and anemia Plan- continue Anoro inhaler

## 2014-07-12 NOTE — Assessment & Plan Note (Signed)
Rate controlled, managed by cardiology

## 2014-08-20 ENCOUNTER — Ambulatory Visit: Payer: Medicare Other | Admitting: Cardiothoracic Surgery

## 2014-09-07 ENCOUNTER — Ambulatory Visit: Payer: Medicare Other | Admitting: Nurse Practitioner

## 2014-09-22 ENCOUNTER — Telehealth: Payer: Self-pay

## 2014-09-22 NOTE — Telephone Encounter (Signed)
Patient died @ [facility] in [place] per [place]

## 2014-09-24 ENCOUNTER — Ambulatory Visit: Payer: Medicare Other | Admitting: Cardiothoracic Surgery

## 2014-10-01 ENCOUNTER — Ambulatory Visit: Payer: Medicare Other | Admitting: Cardiothoracic Surgery

## 2014-10-02 ENCOUNTER — Ambulatory Visit: Payer: Medicare Other | Admitting: Internal Medicine

## 2014-10-07 ENCOUNTER — Ambulatory Visit: Payer: Medicare Other | Admitting: Nurse Practitioner

## 2014-10-12 DEATH — deceased

## 2014-10-15 ENCOUNTER — Ambulatory Visit: Payer: Medicare Other | Admitting: Internal Medicine
